# Patient Record
Sex: Male | Born: 1935 | Race: White | Hispanic: No | Smoking: Never smoker
Health system: Southern US, Community
[De-identification: ages and names within clinical notes are randomized; demographics above are authoritative.]

## PROBLEM LIST (undated history)

## (undated) DIAGNOSIS — F0390 Unspecified dementia without behavioral disturbance: Secondary | ICD-10-CM

## (undated) DIAGNOSIS — M810 Age-related osteoporosis without current pathological fracture: Secondary | ICD-10-CM

---

## 2004-05-08 ENCOUNTER — Ambulatory Visit (HOSPITAL_COMMUNITY): Admission: RE | Admit: 2004-05-08 | Discharge: 2004-05-08 | Payer: Self-pay | Admitting: Ophthalmology

## 2011-08-22 ENCOUNTER — Encounter (HOSPITAL_COMMUNITY): Admission: RE | Admit: 2011-08-22 | Payer: Medicare Other | Source: Ambulatory Visit

## 2013-03-03 ENCOUNTER — Emergency Department (HOSPITAL_COMMUNITY): Payer: Medicare Other

## 2013-03-03 ENCOUNTER — Encounter (HOSPITAL_COMMUNITY): Payer: Self-pay | Admitting: Emergency Medicine

## 2013-03-03 ENCOUNTER — Inpatient Hospital Stay (HOSPITAL_COMMUNITY)
Admission: EM | Admit: 2013-03-03 | Discharge: 2013-03-08 | DRG: 178 | Disposition: A | Discharge status: Skilled Nursing Facility | Payer: Medicare Other | Attending: Internal Medicine | Admitting: Internal Medicine

## 2013-03-03 DIAGNOSIS — H9190 Unspecified hearing loss, unspecified ear: Secondary | ICD-10-CM | POA: Diagnosis present

## 2013-03-03 DIAGNOSIS — F0390 Unspecified dementia without behavioral disturbance: Secondary | ICD-10-CM | POA: Diagnosis present

## 2013-03-03 DIAGNOSIS — F05 Delirium due to known physiological condition: Secondary | ICD-10-CM | POA: Diagnosis present

## 2013-03-03 DIAGNOSIS — Y92009 Unspecified place in unspecified non-institutional (private) residence as the place of occurrence of the external cause: Secondary | ICD-10-CM

## 2013-03-03 DIAGNOSIS — W19XXXA Unspecified fall, initial encounter: Secondary | ICD-10-CM | POA: Diagnosis present

## 2013-03-03 DIAGNOSIS — Z6829 Body mass index (BMI) 29.0-29.9, adult: Secondary | ICD-10-CM

## 2013-03-03 DIAGNOSIS — R404 Transient alteration of awareness: Secondary | ICD-10-CM | POA: Diagnosis not present

## 2013-03-03 DIAGNOSIS — R296 Repeated falls: Secondary | ICD-10-CM

## 2013-03-03 DIAGNOSIS — R531 Weakness: Secondary | ICD-10-CM

## 2013-03-03 DIAGNOSIS — E46 Unspecified protein-calorie malnutrition: Secondary | ICD-10-CM | POA: Diagnosis not present

## 2013-03-03 DIAGNOSIS — M25569 Pain in unspecified knee: Secondary | ICD-10-CM | POA: Diagnosis present

## 2013-03-03 DIAGNOSIS — M810 Age-related osteoporosis without current pathological fracture: Secondary | ICD-10-CM | POA: Diagnosis present

## 2013-03-03 DIAGNOSIS — R5381 Other malaise: Secondary | ICD-10-CM | POA: Diagnosis not present

## 2013-03-03 DIAGNOSIS — J690 Pneumonitis due to inhalation of food and vomit: Principal | ICD-10-CM | POA: Diagnosis present

## 2013-03-03 DIAGNOSIS — R17 Unspecified jaundice: Secondary | ICD-10-CM | POA: Diagnosis present

## 2013-03-03 DIAGNOSIS — R339 Retention of urine, unspecified: Secondary | ICD-10-CM | POA: Diagnosis present

## 2013-03-03 DIAGNOSIS — M6282 Rhabdomyolysis: Secondary | ICD-10-CM | POA: Diagnosis present

## 2013-03-03 HISTORY — DX: Age-related osteoporosis without current pathological fracture: M81.0

## 2013-03-03 HISTORY — DX: Unspecified dementia, unspecified severity, without behavioral disturbance, psychotic disturbance, mood disturbance, and anxiety: F03.90

## 2013-03-03 LAB — CG4 I-STAT (LACTIC ACID): Lactic Acid, Venous: 2.41 mmol/L — ABNORMAL HIGH (ref 0.5–2.2)

## 2013-03-03 LAB — CBC WITH DIFFERENTIAL/PLATELET
Basophils Absolute: 0 10*3/uL (ref 0.0–0.1)
Basophils Relative: 0 % (ref 0–1)
Hemoglobin: 16.3 g/dL (ref 13.0–17.0)
MCH: 33.5 pg (ref 26.0–34.0)
Monocytes Absolute: 1.3 10*3/uL — ABNORMAL HIGH (ref 0.1–1.0)
Monocytes Relative: 12 % (ref 3–12)
RBC: 4.87 MIL/uL (ref 4.22–5.81)
RDW: 13.6 % (ref 11.5–15.5)

## 2013-03-03 LAB — COMPREHENSIVE METABOLIC PANEL
CO2: 21 mEq/L (ref 19–32)
Creatinine, Ser: 0.8 mg/dL (ref 0.50–1.35)
GFR calc Af Amer: 86 mL/min — ABNORMAL LOW (ref >90)
GFR calc non Af Amer: 74 mL/min — ABNORMAL LOW (ref >90)
Glucose, Bld: 98 mg/dL (ref 70–99)
Potassium: 3.9 mEq/L (ref 3.5–5.1)
Total Bilirubin: 2.5 mg/dL — ABNORMAL HIGH (ref 0.3–1.2)

## 2013-03-03 LAB — URINE MICROSCOPIC-ADD ON

## 2013-03-03 LAB — URINALYSIS, ROUTINE W REFLEX MICROSCOPIC
Glucose, UA: NEGATIVE mg/dL
Ketones, ur: 15 mg/dL — AB
Protein, ur: NEGATIVE mg/dL
Specific Gravity, Urine: 1.021 (ref 1.005–1.030)

## 2013-03-03 MED ORDER — LEVOFLOXACIN IN D5W 500 MG/100ML IV SOLN
500.0000 mg | Freq: Once | INTRAVENOUS | Status: AC
  Administered 2013-03-04: 500 mg via INTRAVENOUS
  Filled 2013-03-03 (×2): qty 100

## 2013-03-03 MED ORDER — SODIUM CHLORIDE 0.9 % IV BOLUS (SEPSIS)
500.0000 mL | Freq: Once | INTRAVENOUS | Status: AC
  Administered 2013-03-03: 500 mL via INTRAVENOUS

## 2013-03-03 NOTE — H&P (Signed)
PCP:   No PCP Per Patient   Chief Complaint:  Found down by family.  HPI: Patient is a 79 year old male who normally lives independently.  Was found down by his family today.  THey see him on a daily basis and indicated that he was in his usual state of health yesterday.  He was found in the bathroom today.  THe patient cannot remember the fall.  States pain in R knee but evaluated in ER and no fracture found.  Review of Systems:  Review of Systems - Negative except R knee pain and unaware on circumstances of fall.  Otherwise negative on 12 point review. Past Medical History: Past Medical History  Diagnosis Date  . Dementia   . Osteoporosis    No past surgical history on file.  Medications: Prior to Admission medications   Not on File    Allergies:  No Known Allergies  Social History:  has no tobacco, alcohol, and drug history on file.  Physical Exam: Filed Vitals:   03/03/13 2140 03/03/13 2145  BP: 152/68 120/72  Pulse: 102 101  Temp: 99.7 F (37.6 C)   TempSrc: Oral   Resp: 16 24  SpO2: 90% 91%   General appearance: alert, cooperative and appears stated age Head: Normocephalic, without obvious abnormality, atraumatic Eyes: conjunctivae/corneas clear. PERRL, EOM's intact.  Nose: Nares normal. Septum midline. Mucosa normal. No drainage or sinus tenderness. Throat: lips, mucosa, and tongue normal; teeth and gums normal Neck: no adenopathy, no carotid bruit, no JVD and thyroid not enlarged, symmetric, no tenderness/mass/nodules Resp: clear to auscultation bilaterally Cardio: regular rate and rhythm, S1, S2 normal, no murmur, click, rub or gallop GI: soft, non-tender; bowel sounds normal; no masses,  no organomegaly Extremities: extremities normal, atraumatic, no cyanosis or edema Pulses: 2+ and symmetric Lymph nodes: Cervical adenopathy: no cervical lymphadenopathy Neurologic: Alert and oriented X 3, normal strength and tone. Normal symmetric reflexes.     Labs  on Admission:   Recent Labs  03/03/13 2145  NA 137  K 3.9  CL 101  CO2 21  GLUCOSE 98  BUN 19  CREATININE 0.80  CALCIUM 9.2    Recent Labs  03/03/13 2145  AST 46*  ALT 16  ALKPHOS 55  BILITOT 2.5*  PROT 7.0  ALBUMIN 3.7    Recent Labs  03/03/13 2145  WBC 11.6*  NEUTROABS 9.4*  HGB 16.3  HCT 45.5  MCV 93.4  PLT 206    Recent Labs  03/03/13 2145  CKTOTAL 1446*    Radiological Exams on Admission: Dg Chest 1 View  03/03/2013   CLINICAL DATA:  Dementia, fall.  EXAM: CHEST - 1 VIEW  COMPARISON:  05/03/2004  FINDINGS: Prominent cardiomediastinal contours. Hyperaeration. Mild lung base opacities. Small effusions not excluded. No pneumothorax. Multilevel degenerative change.  IMPRESSION: Prominent cardiac contour, similar to prior.  Hypoaeration with mild lung base opacities ; atelectasis versus infiltrate.   Electronically Signed   By: Jearld Lesch M.D.   On: 03/03/2013 23:00   Dg Pelvis 1-2 Views  03/03/2013   CLINICAL DATA:  Fall and demented.  EXAM: PELVIS - 1-2 VIEW  COMPARISON:  None.  FINDINGS: Two views of the pelvis. Both are moderate to markedly degraded due to overlying pannus. The femoral heads are located. L5-S1 degenerative disc disease. Sacroiliac joints are grossly symmetric. No displaced fracture identified. Equivocal osseous irregularity about the left femoral neck.  IMPRESSION: Moderate to markedly degraded exam secondary to overlying pannus. No gross acute osseous  abnormality. Favor degenerative irregularity about the left femoral neck. Correlate with left-sided symptoms. If symptoms warrant, consider CT.   Electronically Signed   By: Jeronimo Greaves M.D.   On: 03/03/2013 22:50   Ct Head Wo Contrast  03/03/2013   CLINICAL DATA:  Fall.  Demented.  EXAM: CT HEAD WITHOUT CONTRAST  CT CERVICAL SPINE WITHOUT CONTRAST  TECHNIQUE: Multidetector CT imaging of the head and cervical spine was performed following the standard protocol without intravenous contrast.  Multiplanar CT image reconstructions of the cervical spine were also generated.  COMPARISON:  None.  FINDINGS: CT HEAD FINDINGS  Sinuses/Soft tissues: Mucosal thickening of the right maxillary sinus. Bilateral antrostomies. Minimal motion degradation. Clear mastoid air cells.  Intracranial: Expected cerebral atrophy. Relatively mild low density in the periventricular white matter likely related to small vessel disease. No mass lesion, hemorrhage, hydrocephalus, acute infarct, intra-axial, or extra-axial fluid collection.  CT CERVICAL SPINE FINDINGS  Spinal visualization through the bottom of T1. Prevertebral soft tissues are within normal limits. Carotid atherosclerosis. No apical pneumothorax. Cerumen within the bilateral external ear canals. Skull base intact. Multilevel spondylosis. Most advanced at C5-6 and C6-7. Facets are well-aligned. Advanced left greater than right facet arthropathy, including at C3-4 and C4-5.  Coronal reformats demonstrate a normal C1-C2 articulation.  IMPRESSION: 1.  No acute intracranial abnormality. 2. No acute fracture or subluxation within the cervical spine. 3. Advanced spondylosis. Straightening of expected cervical lordosis could be positional, due to muscular spasm, or ligamentous injury.   Electronically Signed   By: Jeronimo Greaves M.D.   On: 03/03/2013 22:29   Ct Cervical Spine Wo Contrast  03/03/2013   CLINICAL DATA:  Fall.  Demented.  EXAM: CT HEAD WITHOUT CONTRAST  CT CERVICAL SPINE WITHOUT CONTRAST  TECHNIQUE: Multidetector CT imaging of the head and cervical spine was performed following the standard protocol without intravenous contrast. Multiplanar CT image reconstructions of the cervical spine were also generated.  COMPARISON:  None.  FINDINGS: CT HEAD FINDINGS  Sinuses/Soft tissues: Mucosal thickening of the right maxillary sinus. Bilateral antrostomies. Minimal motion degradation. Clear mastoid air cells.  Intracranial: Expected cerebral atrophy. Relatively mild low  density in the periventricular white matter likely related to small vessel disease. No mass lesion, hemorrhage, hydrocephalus, acute infarct, intra-axial, or extra-axial fluid collection.  CT CERVICAL SPINE FINDINGS  Spinal visualization through the bottom of T1. Prevertebral soft tissues are within normal limits. Carotid atherosclerosis. No apical pneumothorax. Cerumen within the bilateral external ear canals. Skull base intact. Multilevel spondylosis. Most advanced at C5-6 and C6-7. Facets are well-aligned. Advanced left greater than right facet arthropathy, including at C3-4 and C4-5.  Coronal reformats demonstrate a normal C1-C2 articulation.  IMPRESSION: 1.  No acute intracranial abnormality. 2. No acute fracture or subluxation within the cervical spine. 3. Advanced spondylosis. Straightening of expected cervical lordosis could be positional, due to muscular spasm, or ligamentous injury.   Electronically Signed   By: Jeronimo Greaves M.D.   On: 03/03/2013 22:29   Dg Knee Right Port  03/03/2013   CLINICAL DATA:  Fall with right knee pain and abrasion.  EXAM: PORTABLE RIGHT KNEE - 1-2 VIEW  COMPARISON:  None.  FINDINGS: AP and cross-table lateral views. Minimal medial mild patellofemoral joint space narrowing and osteophyte formation. No acute fracture or dislocation. Difficult to exclude a small suprapatellar joint effusion.  IMPRESSION: Possible small suprapatellar joint effusion. No fracture identified. If possible, dedicated four view series should be considered.   Electronically Signed   By: Ronaldo Miyamoto  Reche Dixon M.D.   On: 03/03/2013 23:50   Orders placed during the hospital encounter of 03/03/13  . EKG 12-LEAD  . EKG 12-LEAD    Assessment/Plan Active Problems:   Aspiration into lower respiratory tract Will get swallowing eval at bedside and consider further eval in AM.  Levaquin.  WIll also check U/A as CXR is very unclear as to whether there is a true pneumonia. Dementia:  He has not been seen in our  office since 2013.  Given lack of continuity, will have his regular MD assess in the AM. Osteoporosis- Previously on Reclast, overdue.  May need to resume given fall. PT/OT and possible social work for home needs. IV hydration  Kamiryn Bezanson W 03/03/2013, 11:54 PM

## 2013-03-03 NOTE — ED Notes (Signed)
Yao MD at bedside. 

## 2013-03-03 NOTE — ED Notes (Signed)
Per EMS pt fell at home at 11AM has dementia- down for unknown time on bathroom floor (last seen yesterday morning). Family has been attempting to get patient up since then. Pt was agitated so family called to get assistance. Denies LOC sts he remembers falling but couldn't get up. Baseline pt walks with walker. EMS sts pt was bilaterally weak when lifting pt up. Pt denies pain. Pt does not have PCP.

## 2013-03-03 NOTE — ED Provider Notes (Signed)
CSN: [id number]     Arrival date & time 03/03/13  2135 History   None    Chief Complaint  Patient presents with  . Fall   (Consider location/radiation/quality/duration/timing/severity/associated sxs/prior Treatment) HPI Comments: 79 year old male history multiple falls and was found down by family today. Patient does live at home. Family has daily. Reports he was is normal state of health yesterday. When writing the house and they found him down in the bathroom. Unknown, fall. Patient is unable to give detailed information of the fall. He does state he is not hurting anywhere other than his right knee. States it is on fall multiple days ago. Family endorses this. Patient states mild pain on the right knee. It is minimal. Is resolving. No radiation of pain. Intact range of motion. No changes to health otherwise.  Patient is a 79 y.o. male presenting with fall.  Fall This is a recurrent problem. The current episode started today. The problem occurs every several days. The problem has been unchanged. Nothing aggravates the symptoms. He has tried nothing for the symptoms. The treatment provided no relief.    Past Medical History  Diagnosis Date  . Dementia   . Osteoporosis    No past surgical history on file. No family history on file. History  Substance Use Topics  . Smoking status: Not on file  . Smokeless tobacco: Not on file  . Alcohol Use: Not on file    Review of Systems  Unable to perform ROS: Dementia    Allergies  Review of patient's allergies indicates no known allergies.  Home Medications  No current outpatient prescriptions on file. BP 120/72  Pulse 101  Temp(Src) 99.7 F (37.6 C) (Oral)  Resp 24  SpO2 91% Physical Exam  Nursing note and vitals reviewed. Constitutional: He is oriented to person, place, and time. He appears well-developed and well-nourished.  HENT:  Head: Normocephalic and atraumatic.  Eyes: EOM are normal. Pupils are equal, round, and reactive  to light.  Neck: Normal range of motion.  Cardiovascular: Normal rate, regular rhythm and intact distal pulses.   Pulmonary/Chest: Effort normal and breath sounds normal. No respiratory distress.  Abdominal: Soft. He exhibits no distension. There is no tenderness.  Musculoskeletal: Normal range of motion.  Neurological: He is alert and oriented to person, place, and time. No cranial nerve deficit. He exhibits normal muscle tone. Coordination normal.  Skin: Skin is warm and dry. No rash noted.  Psychiatric: He has a normal mood and affect. His behavior is normal. Judgment and thought content normal.    ED Course  Procedures (including critical care time) Labs Review Labs Reviewed  CBC WITH DIFFERENTIAL - Abnormal; Notable for the following:    WBC 11.6 (*)    Neutrophils Relative % 82 (*)    Neutro Abs 9.4 (*)    Lymphocytes Relative 7 (*)    Monocytes Absolute 1.3 (*)    All other components within normal limits  COMPREHENSIVE METABOLIC PANEL - Abnormal; Notable for the following:    AST 46 (*)    Total Bilirubin 2.5 (*)    GFR calc non Af Amer 74 (*)    GFR calc Af Amer 86 (*)    All other components within normal limits  CK - Abnormal; Notable for the following:    Total CK 1446 (*)    All other components within normal limits  URINALYSIS, ROUTINE W REFLEX MICROSCOPIC - Abnormal; Notable for the following:    Color, Urine AMBER (*)  Hgb urine dipstick MODERATE (*)    Bilirubin Urine SMALL (*)    Ketones, ur 15 (*)    All other components within normal limits  CG4 I-STAT (LACTIC ACID) - Abnormal; Notable for the following:    Lactic Acid, Venous 2.41 (*)    All other components within normal limits  CULTURE, BLOOD (ROUTINE X 2)  CULTURE, BLOOD (ROUTINE X 2)  URINE MICROSCOPIC-ADD ON  POCT I-STAT TROPONIN I   Imaging Review Dg Chest 1 View  03/03/2013   CLINICAL DATA:  Dementia, fall.  EXAM: CHEST - 1 VIEW  COMPARISON:  05/03/2004  FINDINGS: Prominent  cardiomediastinal contours. Hyperaeration. Mild lung base opacities. Small effusions not excluded. No pneumothorax. Multilevel degenerative change.  IMPRESSION: Prominent cardiac contour, similar to prior.  Hypoaeration with mild lung base opacities ; atelectasis versus infiltrate.   Electronically Signed   By: Jearld Lesch M.D.   On: 03/03/2013 23:00   Dg Pelvis 1-2 Views  03/03/2013   CLINICAL DATA:  Fall and demented.  EXAM: PELVIS - 1-2 VIEW  COMPARISON:  None.  FINDINGS: Two views of the pelvis. Both are moderate to markedly degraded due to overlying pannus. The femoral heads are located. L5-S1 degenerative disc disease. Sacroiliac joints are grossly symmetric. No displaced fracture identified. Equivocal osseous irregularity about the left femoral neck.  IMPRESSION: Moderate to markedly degraded exam secondary to overlying pannus. No gross acute osseous abnormality. Favor degenerative irregularity about the left femoral neck. Correlate with left-sided symptoms. If symptoms warrant, consider CT.   Electronically Signed   By: Jeronimo Greaves M.D.   On: 03/03/2013 22:50   Ct Head Wo Contrast  03/03/2013   CLINICAL DATA:  Fall.  Demented.  EXAM: CT HEAD WITHOUT CONTRAST  CT CERVICAL SPINE WITHOUT CONTRAST  TECHNIQUE: Multidetector CT imaging of the head and cervical spine was performed following the standard protocol without intravenous contrast. Multiplanar CT image reconstructions of the cervical spine were also generated.  COMPARISON:  None.  FINDINGS: CT HEAD FINDINGS  Sinuses/Soft tissues: Mucosal thickening of the right maxillary sinus. Bilateral antrostomies. Minimal motion degradation. Clear mastoid air cells.  Intracranial: Expected cerebral atrophy. Relatively mild low density in the periventricular white matter likely related to small vessel disease. No mass lesion, hemorrhage, hydrocephalus, acute infarct, intra-axial, or extra-axial fluid collection.  CT CERVICAL SPINE FINDINGS  Spinal  visualization through the bottom of T1. Prevertebral soft tissues are within normal limits. Carotid atherosclerosis. No apical pneumothorax. Cerumen within the bilateral external ear canals. Skull base intact. Multilevel spondylosis. Most advanced at C5-6 and C6-7. Facets are well-aligned. Advanced left greater than right facet arthropathy, including at C3-4 and C4-5.  Coronal reformats demonstrate a normal C1-C2 articulation.  IMPRESSION: 1.  No acute intracranial abnormality. 2. No acute fracture or subluxation within the cervical spine. 3. Advanced spondylosis. Straightening of expected cervical lordosis could be positional, due to muscular spasm, or ligamentous injury.   Electronically Signed   By: Jeronimo Greaves M.D.   On: 03/03/2013 22:29   Ct Cervical Spine Wo Contrast  03/03/2013   CLINICAL DATA:  Fall.  Demented.  EXAM: CT HEAD WITHOUT CONTRAST  CT CERVICAL SPINE WITHOUT CONTRAST  TECHNIQUE: Multidetector CT imaging of the head and cervical spine was performed following the standard protocol without intravenous contrast. Multiplanar CT image reconstructions of the cervical spine were also generated.  COMPARISON:  None.  FINDINGS: CT HEAD FINDINGS  Sinuses/Soft tissues: Mucosal thickening of the right maxillary sinus. Bilateral antrostomies. Minimal motion degradation. Clear  mastoid air cells.  Intracranial: Expected cerebral atrophy. Relatively mild low density in the periventricular white matter likely related to small vessel disease. No mass lesion, hemorrhage, hydrocephalus, acute infarct, intra-axial, or extra-axial fluid collection.  CT CERVICAL SPINE FINDINGS  Spinal visualization through the bottom of T1. Prevertebral soft tissues are within normal limits. Carotid atherosclerosis. No apical pneumothorax. Cerumen within the bilateral external ear canals. Skull base intact. Multilevel spondylosis. Most advanced at C5-6 and C6-7. Facets are well-aligned. Advanced left greater than right facet  arthropathy, including at C3-4 and C4-5.  Coronal reformats demonstrate a normal C1-C2 articulation.  IMPRESSION: 1.  No acute intracranial abnormality. 2. No acute fracture or subluxation within the cervical spine. 3. Advanced spondylosis. Straightening of expected cervical lordosis could be positional, due to muscular spasm, or ligamentous injury.   Electronically Signed   By: Jeronimo Greaves M.D.   On: 03/03/2013 22:29   Dg Knee Right Port  03/03/2013   CLINICAL DATA:  Fall with right knee pain and abrasion.  EXAM: PORTABLE RIGHT KNEE - 1-2 VIEW  COMPARISON:  None.  FINDINGS: AP and cross-table lateral views. Minimal medial mild patellofemoral joint space narrowing and osteophyte formation. No acute fracture or dislocation. Difficult to exclude a small suprapatellar joint effusion.  IMPRESSION: Possible small suprapatellar joint effusion. No fracture identified. If possible, dedicated four view series should be considered.   Electronically Signed   By: Jeronimo Greaves M.D.   On: 03/03/2013 23:50    EKG Interpretation    Date/Time:  Wednesday March 03 2013 21:37:52 EST Ventricular Rate:  102 PR Interval:  152 QRS Duration: 87 QT Interval:  363 QTC Calculation: 473 R Axis:   6 Text Interpretation:  Sinus tachycardia Probable left atrial enlargement Abnormal R-wave progression, early transition Borderline repolarization abnormality Rate faster since previous  Confirmed by YAO  MD, DAVID [phone]) on 03/03/2013 9:44:16 PM            MDM   1. Fall at home, initial encounter   2. Aspiration pneumonia     79 year old male status Stefhanie Kachmar fall at home. Patient on exam has no tenderness palpation, injuries or obvious deformities. However secondary patient been down for unknown amount of time and the patient with dementia and unable to get a good story labs and imaging initiated. CT head and C-spine normal. X-rays of pelvis within normal limits although questionable finding on the pelvis regarding  fever. CT pelvis was obtained showing no femur fracture. On arrival patient was noted to be slightly tachycardic with SpO2 90. Chest x-ray showed possible developing aspiration pneumonia. CK also was slightly elevated at 1500. Patient was started on antibiotics given fluids for elevated CK. Multiple comorbidities and elderly patient will admit to  Medicine for continued evaluation and treatment remained stable with no further acute issues ED    Bridgett Larsson, MD 03/03/13 2358

## 2013-03-04 ENCOUNTER — Encounter (HOSPITAL_COMMUNITY): Payer: Self-pay | Admitting: *Deleted

## 2013-03-04 DIAGNOSIS — R339 Retention of urine, unspecified: Secondary | ICD-10-CM | POA: Diagnosis present

## 2013-03-04 DIAGNOSIS — R5381 Other malaise: Secondary | ICD-10-CM

## 2013-03-04 DIAGNOSIS — R296 Repeated falls: Secondary | ICD-10-CM

## 2013-03-04 DIAGNOSIS — M6282 Rhabdomyolysis: Secondary | ICD-10-CM | POA: Diagnosis present

## 2013-03-04 LAB — COMPREHENSIVE METABOLIC PANEL
ALT: 15 U/L (ref 0–53)
Alkaline Phosphatase: 50 U/L (ref 39–117)
BUN: 18 mg/dL (ref 6–23)
CO2: 21 mEq/L (ref 19–32)
Creatinine, Ser: 0.72 mg/dL (ref 0.50–1.35)
GFR calc Af Amer: 89 mL/min — ABNORMAL LOW (ref >90)
GFR calc non Af Amer: 77 mL/min — ABNORMAL LOW (ref >90)
Glucose, Bld: 86 mg/dL (ref 70–99)
Potassium: 3.4 mEq/L — ABNORMAL LOW (ref 3.5–5.1)
Total Protein: 6.3 g/dL (ref 6.0–8.3)

## 2013-03-04 LAB — CBC
HCT: 43.1 % (ref 39.0–52.0)
Hemoglobin: 15.3 g/dL (ref 13.0–17.0)
MCH: 33.2 pg (ref 26.0–34.0)
MCHC: 35.5 g/dL (ref 30.0–36.0)
RBC: 4.61 MIL/uL (ref 4.22–5.81)

## 2013-03-04 LAB — TSH: TSH: 3.391 u[IU]/mL (ref 0.350–4.500)

## 2013-03-04 LAB — BILIRUBIN, DIRECT: Bilirubin, Direct: 0.5 mg/dL — ABNORMAL HIGH (ref 0.0–0.3)

## 2013-03-04 MED ORDER — TAMSULOSIN HCL 0.4 MG PO CAPS
0.4000 mg | ORAL_CAPSULE | Freq: Every day | ORAL | Status: DC
  Administered 2013-03-04 – 2013-03-08 (×5): 0.4 mg via ORAL
  Filled 2013-03-04 (×7): qty 1

## 2013-03-04 MED ORDER — POTASSIUM CHLORIDE IN NACL 20-0.9 MEQ/L-% IV SOLN
INTRAVENOUS | Status: DC
  Administered 2013-03-04 (×2): via INTRAVENOUS
  Administered 2013-03-05 (×2): 75 mL/h via INTRAVENOUS
  Administered 2013-03-06: 21:00:00 via INTRAVENOUS
  Filled 2013-03-04 (×10): qty 1000

## 2013-03-04 MED ORDER — ONDANSETRON HCL 4 MG/2ML IJ SOLN: 4.0000 mg | Freq: Four times a day (QID) | INTRAMUSCULAR | Status: DC | PRN

## 2013-03-04 MED ORDER — HYDROCODONE-ACETAMINOPHEN 5-325 MG PO TABS
1.0000 | ORAL_TABLET | ORAL | Status: DC | PRN
  Administered 2013-03-04: 1 via ORAL
  Administered 2013-03-04: 2 via ORAL
  Filled 2013-03-04 (×2): qty 2

## 2013-03-04 MED ORDER — ACETAMINOPHEN 650 MG RE SUPP: 650.0000 mg | Freq: Four times a day (QID) | RECTAL | Status: DC | PRN

## 2013-03-04 MED ORDER — ACETAMINOPHEN 325 MG PO TABS: 650.0000 mg | ORAL_TABLET | Freq: Four times a day (QID) | ORAL | Status: DC | PRN

## 2013-03-04 MED ORDER — ENOXAPARIN SODIUM 40 MG/0.4ML ~~LOC~~ SOLN
40.0000 mg | Freq: Every day | SUBCUTANEOUS | Status: DC
  Administered 2013-03-04 – 2013-03-08 (×5): 40 mg via SUBCUTANEOUS
  Filled 2013-03-04 (×5): qty 0.4

## 2013-03-04 MED ORDER — LEVOFLOXACIN 500 MG PO TABS
500.0000 mg | ORAL_TABLET | Freq: Every day | ORAL | Status: DC
  Administered 2013-03-04 – 2013-03-07 (×4): 500 mg via ORAL
  Filled 2013-03-04 (×5): qty 1

## 2013-03-04 MED ORDER — DOCUSATE SODIUM 100 MG PO CAPS
100.0000 mg | ORAL_CAPSULE | Freq: Two times a day (BID) | ORAL | Status: DC
  Administered 2013-03-04 – 2013-03-08 (×8): 100 mg via ORAL
  Filled 2013-03-04 (×11): qty 1

## 2013-03-04 MED ORDER — ZOLPIDEM TARTRATE 5 MG PO TABS: 5.0000 mg | ORAL_TABLET | Freq: Every evening | ORAL | Status: DC | PRN

## 2013-03-04 MED ORDER — ONDANSETRON HCL 4 MG PO TABS: 4.0000 mg | ORAL_TABLET | Freq: Four times a day (QID) | ORAL | Status: DC | PRN

## 2013-03-04 NOTE — Evaluation (Signed)
Clinical/Bedside Swallow Evaluation Patient Details  Name: Anthony [name] MRN: [id number] Date of Birth: July 05, 1915  Today's Date: 03/04/2013 Time: 7846-9629 SLP Time Calculation (min): 30 min  Past Medical History:  Past Medical History  Diagnosis Date  . Dementia   . Osteoporosis    Past Surgical History: History reviewed. No pertinent past surgical history. HPI:  Patient is a 79 year old male who normally lives independently. Was found down by his family today. THey see him on a daily basis and indicated that he was in his usual state of health yesterday. He was found in the bathroom today. THe patient cannot remember the fall. States pain in R knee but evaluated in ER and no fracture found. MD has given pt dx of aspiration pna or aspiration pneumonitis.    Assessment / Plan / Recommendation Clinical Impression  Pt presents with adequate management of solids and liquids with small sips and appropriate posture. No overt signs of aspiration. Pts family reports pt has consumed regular solids and liquids at home pta without difficulty. Suspect any aspriation pna more likely due to being down all night, aspirating secretions or reflux, than direct aspiration of PO based on observation. Discussed basic precautions with family, discussing risk due to lethargy (pt being given pain medication) and respiratory distress. At this time pt may initaite a regular diet and thin liquids. No SLP f/u needed.     Aspiration Risk  Mild    Diet Recommendation Regular;Thin liquid   Liquid Administration via: Cup;Straw Medication Administration: Whole meds with liquid Supervision: Patient able to self feed Postural Changes and/or Swallow Maneuvers: Seated upright 90 degrees    Other  Recommendations Oral Care Recommendations: Oral care BID   Follow Up Recommendations  None    Frequency and Duration        Pertinent Vitals/Pain NA    SLP Swallow Goals     Swallow Study Prior Functional Status  Type of Home: House Available Help at Discharge: Family;Available PRN/intermittently    General HPI: Patient is a 79 year old male who normally lives independently. Was found down by his family today. THey see him on a daily basis and indicated that he was in his usual state of health yesterday. He was found in the bathroom today. THe patient cannot remember the fall. States pain in R knee but evaluated in ER and no fracture found. MD has given pt dx of aspiration pna or aspiration pneumonitis.  Type of Study: Bedside swallow evaluation Diet Prior to this Study: NPO Temperature Spikes Noted: No Respiratory Status: Nasal cannula History of Recent Intubation: No Behavior/Cognition: Alert;Cooperative;Pleasant mood Oral Cavity - Dentition: Missing dentition Self-Feeding Abilities: Able to feed self Patient Positioning: Upright in chair Baseline Vocal Quality: Clear Volitional Cough: Strong Volitional Swallow: Able to elicit    Oral/Motor/Sensory Function Overall Oral Motor/Sensory Function: Appears within functional limits for tasks assessed   Ice Chips     Thin Liquid Thin Liquid: Within functional limits    Nectar Thick Nectar Thick Liquid: Not tested   Honey Thick Honey Thick Liquid: Not tested   Puree Puree: Within functional limits   Solid   GO    Solid: Within functional limits      [facility], [place] CCC-SLP [phone]  Anthony [name] 03/04/2013,2:02 PM

## 2013-03-04 NOTE — Consult Note (Signed)
Physical Medicine and Rehabilitation Consult Reason for Consult: Deconditioning/rhabdomyolysis/pneumonia Referring Physician: Dr. Wylene Simmer   HPI: Anthony [name] is a 79 y.o. right-handed male with documented history of mild dementia Living alone in independent prior to admission. Admitted 03/03/2013 after being found down in the bathroom. Cranial CT scan was negative for any acute changes. CT cervical spine negative. X-rays of right knee no fracture possible small suprapatellar joint effusion. Chest x-ray with atelectasis versus infiltrate. Patient is on a regular diet. Patient placed on empiric Levaquin. Subcutaneous Lovenox added for DVT prophylaxis. Noted bouts of urinary retention Flomax at it plan voiding trial. Occupational therapy evaluation completed with recommendations of physical medicine rehabilitation consult to consider inpatient rehabilitation services.  Extremely hard of hearing. No hearing aides. Daughter at bedside. Review of Systems  HENT: Positive for hearing loss.   Musculoskeletal: Positive for joint pain and myalgias.  Psychiatric/Behavioral: Positive for memory loss.  All other systems reviewed and are negative.   Past Medical History  Diagnosis Date  . Dementia   . Osteoporosis    History reviewed. No pertinent past surgical history. History reviewed. No pertinent family history. Social History:  reports that he has never smoked. He has never used smokeless tobacco. He reports that he does not drink alcohol. His drug history is not on file. Allergies: No Known Allergies No prescriptions prior to admission    Home: Home Living Family/patient expects to be discharged to:: Inpatient rehab (vs SNF) Living Arrangements: Alone Available Help at Discharge: Family;Available PRN/intermittently Type of Home: House Home Equipment: Grab bars - tub/shower;Grab bars - toilet Additional Comments: per daughter, Has a walk in shower with zero entry, no curtain or door, no  bathroom DME except he has installed several grab bars throughout bathroom.  Functional History: Prior Function Comments: Local daughter reports he has been independent except for recent decline ~10 days.  Daughter has been checking in daily and occasionally providing assistance with IADLs.  Patient with h/o dementia and daughters decribe him as "gruff".  This OT did not see any gruffness during the evaluation Functional Status:  Mobility: Bed Mobility Bed Mobility: Rolling Right;Right Sidelying to Sit;Sitting - Scoot to Edge of Bed Rolling Right: 5: Supervision Right Sidelying to Sit: 4: Min assist Sitting - Scoot to Edge of Bed: 4: Min assist Transfers Sit to Stand: 1: +1 Total assist Stand to Sit: 2: Max assist      ADL: ADL Eating/Feeding: NPO Grooming: Performed;Wash/dry hands;Moderate assistance Where Assessed - Grooming: Supported standing Upper Body Bathing: Simulated;Set up Where Assessed - Upper Body Bathing: Supported sitting Lower Body Bathing: Simulated;+1 Total assistance Where Assessed - Lower Body Bathing: Supported sitting;Supported sit to stand;Supported standing Upper Body Dressing: Simulated;Minimal assistance Where Assessed - Upper Body Dressing: Supported sitting Lower Body Dressing: Simulated;Performed;+1 Total assistance Where Assessed - Lower Body Dressing: Supported sitting;Supported sit to stand;Supported standing Toilet Transfer: Performed;+2 Total assistance (bed><recliner) Statistician Method: Squat pivot Transfers/Ambulation Related to ADLs: Patient favoring his painful RLE during total +2 squat pivot transfer and with sit><stand at sink.  Patient relied heavily on sink to stand with Total assist.   ADL Comments: Patient unable to unweight one hand in standing to wash or dry both hands at same time, able to remove gripper socks however unable to donn them due to right knee pain.  Cognition: Cognition Overall Cognitive Status: History of cognitive  impairments - at baseline [facility] for tasks assessed) Orientation Level: Oriented to person;Disoriented to time;Disoriented to place Cognition Arousal/Alertness: Awake/alert Behavior During Therapy:  WFL for tasks assessed/performed Overall Cognitive Status: History of cognitive impairments - at baseline [facility] for tasks assessed)  Blood pressure 120/71, pulse 112, temperature 98.1 F (36.7 C), temperature source Axillary, resp. rate 16, height 5\' 11"  (1.803 m), weight 93.7 kg (206 lb 9.1 oz), SpO2 84.00%. Physical Exam  Constitutional:  79 year old white male very hard of hearing  HENT:  Head: Normocephalic.  Neck: Normal range of motion. Neck supple. No thyromegaly present.  Cardiovascular: Normal rate and regular rhythm.   Respiratory: Breath sounds normal. No respiratory distress.  GI: Soft. Bowel sounds are normal. He exhibits no distension.  Neurological: He is alert.  Patient made good eye contact with examiner however exam limited secondary to being very hard of hearing. He did follow simple demonstrate commands.  Skin: Skin is warm and dry.   motor strength is 5/5 bilateral deltoid, bicep, tricep, grip 4 minus/5 bilateral hip flexor 4 minus right knee extensor 5 left knee extensor 5 ankle dorsiflexors and plantar flexors  Results for orders placed during the hospital encounter of 03/03/13 (from the past 24 hour(s))  CBC WITH DIFFERENTIAL     Status: Abnormal   Collection Time    03/03/13  9:45 PM      Result Value Range   WBC 11.6 (*) 4.0 - 10.5 K/uL   RBC 4.87  4.22 - 5.81 MIL/uL   Hemoglobin 16.3  13.0 - 17.0 g/dL   HCT 29.5  62.1 - 30.8 %   MCV 93.4  78.0 - 100.0 fL   MCH 33.5  26.0 - 34.0 pg   MCHC 35.8  30.0 - 36.0 g/dL   RDW 65.7  84.6 - 96.2 %   Platelets 206  150 - 400 K/uL   Neutrophils Relative % 82 (*) 43 - 77 %   Neutro Abs 9.4 (*) 1.7 - 7.7 K/uL   Lymphocytes Relative 7 (*) 12 - 46 %   Lymphs Abs 0.8  0.7 - 4.0 K/uL   Monocytes Relative 12  3 - 12 %    Monocytes Absolute 1.3 (*) 0.1 - 1.0 K/uL   Eosinophils Relative 0  0 - 5 %   Eosinophils Absolute 0.0  0.0 - 0.7 K/uL   Basophils Relative 0  0 - 1 %   Basophils Absolute 0.0  0.0 - 0.1 K/uL  COMPREHENSIVE METABOLIC PANEL     Status: Abnormal   Collection Time    03/03/13  9:45 PM      Result Value Range   Sodium 137  135 - 145 mEq/L   Potassium 3.9  3.5 - 5.1 mEq/L   Chloride 101  96 - 112 mEq/L   CO2 21  19 - 32 mEq/L   Glucose, Bld 98  70 - 99 mg/dL   BUN 19  6 - 23 mg/dL   Creatinine, Ser 9.52  0.50 - 1.35 mg/dL   Calcium 9.2  8.4 - 84.1 mg/dL   Total Protein 7.0  6.0 - 8.3 g/dL   Albumin 3.7  3.5 - 5.2 g/dL   AST 46 (*) 0 - 37 U/L   ALT 16  0 - 53 U/L   Alkaline Phosphatase 55  39 - 117 U/L   Total Bilirubin 2.5 (*) 0.3 - 1.2 mg/dL   GFR calc non Af Amer 74 (*) >90 mL/min   GFR calc Af Amer 86 (*) >90 mL/min  CK     Status: Abnormal   Collection Time    03/03/13  9:45 PM  Result Value Range   Total CK 1446 (*) 7 - 232 U/L  POCT I-STAT TROPONIN I     Status: None   Collection Time    03/03/13 10:50 PM      Result Value Range   Troponin i, poc 0.03  0.00 - 0.08 ng/mL   Comment 3           URINALYSIS, ROUTINE W REFLEX MICROSCOPIC     Status: Abnormal   Collection Time    03/03/13 10:52 PM      Result Value Range   Color, Urine AMBER (*) YELLOW   APPearance CLEAR  CLEAR   Specific Gravity, Urine 1.021  1.005 - 1.030   pH 5.0  5.0 - 8.0   Glucose, UA NEGATIVE  NEGATIVE mg/dL   Hgb urine dipstick MODERATE (*) NEGATIVE   Bilirubin Urine SMALL (*) NEGATIVE   Ketones, ur 15 (*) NEGATIVE mg/dL   Protein, ur NEGATIVE  NEGATIVE mg/dL   Urobilinogen, UA 1.0  0.0 - 1.0 mg/dL   Nitrite NEGATIVE  NEGATIVE   Leukocytes, UA NEGATIVE  NEGATIVE  URINE MICROSCOPIC-ADD ON     Status: None   Collection Time    03/03/13 10:52 PM      Result Value Range   Squamous Epithelial / LPF RARE  RARE   WBC, UA 0-2  <3 WBC/hpf   RBC / HPF 11-20  <3 RBC/hpf   Bacteria, UA RARE  RARE   CG4 I-STAT (LACTIC ACID)     Status: Abnormal   Collection Time    03/03/13 11:52 PM      Result Value Range   Lactic Acid, Venous 2.41 (*) 0.5 - 2.2 mmol/L  CBC     Status: None   Collection Time    03/04/13  4:25 AM      Result Value Range   WBC 10.1  4.0 - 10.5 K/uL   RBC 4.61  4.22 - 5.81 MIL/uL   Hemoglobin 15.3  13.0 - 17.0 g/dL   HCT 11.9  14.7 - 82.9 %   MCV 93.5  78.0 - 100.0 fL   MCH 33.2  26.0 - 34.0 pg   MCHC 35.5  30.0 - 36.0 g/dL   RDW 56.2  13.0 - 86.5 %   Platelets 204  150 - 400 K/uL  COMPREHENSIVE METABOLIC PANEL     Status: Abnormal   Collection Time    03/04/13  4:25 AM      Result Value Range   Sodium 139  135 - 145 mEq/L   Potassium 3.4 (*) 3.5 - 5.1 mEq/L   Chloride 103  96 - 112 mEq/L   CO2 21  19 - 32 mEq/L   Glucose, Bld 86  70 - 99 mg/dL   BUN 18  6 - 23 mg/dL   Creatinine, Ser 7.84  0.50 - 1.35 mg/dL   Calcium 8.8  8.4 - 69.6 mg/dL   Total Protein 6.3  6.0 - 8.3 g/dL   Albumin 3.3 (*) 3.5 - 5.2 g/dL   AST 47 (*) 0 - 37 U/L   ALT 15  0 - 53 U/L   Alkaline Phosphatase 50  39 - 117 U/L   Total Bilirubin 2.3 (*) 0.3 - 1.2 mg/dL   GFR calc non Af Amer 77 (*) >90 mL/min   GFR calc Af Amer 89 (*) >90 mL/min  TSH     Status: None   Collection Time    03/04/13  4:25 AM  Result Value Range   TSH 3.391  0.350 - 4.500 uIU/mL  CK     Status: Abnormal   Collection Time    03/04/13  4:25 AM      Result Value Range   Total CK 1276 (*) 7 - 232 U/L  BILIRUBIN, DIRECT     Status: Abnormal   Collection Time    03/04/13  8:55 AM      Result Value Range   Bilirubin, Direct 0.5 (*) 0.0 - 0.3 mg/dL   Dg Chest 1 View  16/04/958   CLINICAL DATA:  Dementia, fall.  EXAM: CHEST - 1 VIEW  COMPARISON:  05/03/2004  FINDINGS: Prominent cardiomediastinal contours. Hyperaeration. Mild lung base opacities. Small effusions not excluded. No pneumothorax. Multilevel degenerative change.  IMPRESSION: Prominent cardiac contour, similar to prior.  Hypoaeration with  mild lung base opacities ; atelectasis versus infiltrate.   Electronically Signed   By: Jearld Lesch M.D.   On: 03/03/2013 23:00   Dg Pelvis 1-2 Views  03/03/2013   CLINICAL DATA:  Fall and demented.  EXAM: PELVIS - 1-2 VIEW  COMPARISON:  None.  FINDINGS: Two views of the pelvis. Both are moderate to markedly degraded due to overlying pannus. The femoral heads are located. L5-S1 degenerative disc disease. Sacroiliac joints are grossly symmetric. No displaced fracture identified. Equivocal osseous irregularity about the left femoral neck.  IMPRESSION: Moderate to markedly degraded exam secondary to overlying pannus. No gross acute osseous abnormality. Favor degenerative irregularity about the left femoral neck. Correlate with left-sided symptoms. If symptoms warrant, consider CT.   Electronically Signed   By: Jeronimo Greaves M.D.   On: 03/03/2013 22:50   Ct Head Wo Contrast  03/03/2013   CLINICAL DATA:  Fall.  Demented.  EXAM: CT HEAD WITHOUT CONTRAST  CT CERVICAL SPINE WITHOUT CONTRAST  TECHNIQUE: Multidetector CT imaging of the head and cervical spine was performed following the standard protocol without intravenous contrast. Multiplanar CT image reconstructions of the cervical spine were also generated.  COMPARISON:  None.  FINDINGS: CT HEAD FINDINGS  Sinuses/Soft tissues: Mucosal thickening of the right maxillary sinus. Bilateral antrostomies. Minimal motion degradation. Clear mastoid air cells.  Intracranial: Expected cerebral atrophy. Relatively mild low density in the periventricular white matter likely related to small vessel disease. No mass lesion, hemorrhage, hydrocephalus, acute infarct, intra-axial, or extra-axial fluid collection.  CT CERVICAL SPINE FINDINGS  Spinal visualization through the bottom of T1. Prevertebral soft tissues are within normal limits. Carotid atherosclerosis. No apical pneumothorax. Cerumen within the bilateral external ear canals. Skull base intact. Multilevel spondylosis.  Most advanced at C5-6 and C6-7. Facets are well-aligned. Advanced left greater than right facet arthropathy, including at C3-4 and C4-5.  Coronal reformats demonstrate a normal C1-C2 articulation.  IMPRESSION: 1.  No acute intracranial abnormality. 2. No acute fracture or subluxation within the cervical spine. 3. Advanced spondylosis. Straightening of expected cervical lordosis could be positional, due to muscular spasm, or ligamentous injury.   Electronically Signed   By: Jeronimo Greaves M.D.   On: 03/03/2013 22:29   Ct Cervical Spine Wo Contrast  03/03/2013   CLINICAL DATA:  Fall.  Demented.  EXAM: CT HEAD WITHOUT CONTRAST  CT CERVICAL SPINE WITHOUT CONTRAST  TECHNIQUE: Multidetector CT imaging of the head and cervical spine was performed following the standard protocol without intravenous contrast. Multiplanar CT image reconstructions of the cervical spine were also generated.  COMPARISON:  None.  FINDINGS: CT HEAD FINDINGS  Sinuses/Soft tissues: Mucosal thickening of the right  maxillary sinus. Bilateral antrostomies. Minimal motion degradation. Clear mastoid air cells.  Intracranial: Expected cerebral atrophy. Relatively mild low density in the periventricular white matter likely related to small vessel disease. No mass lesion, hemorrhage, hydrocephalus, acute infarct, intra-axial, or extra-axial fluid collection.  CT CERVICAL SPINE FINDINGS  Spinal visualization through the bottom of T1. Prevertebral soft tissues are within normal limits. Carotid atherosclerosis. No apical pneumothorax. Cerumen within the bilateral external ear canals. Skull base intact. Multilevel spondylosis. Most advanced at C5-6 and C6-7. Facets are well-aligned. Advanced left greater than right facet arthropathy, including at C3-4 and C4-5.  Coronal reformats demonstrate a normal C1-C2 articulation.  IMPRESSION: 1.  No acute intracranial abnormality. 2. No acute fracture or subluxation within the cervical spine. 3. Advanced spondylosis.  Straightening of expected cervical lordosis could be positional, due to muscular spasm, or ligamentous injury.   Electronically Signed   By: Jeronimo Greaves M.D.   On: 03/03/2013 22:29   Ct Pelvis Wo Contrast  03/04/2013   CLINICAL DATA:  Fall it ulna. Demented. Rule out femoral neck fracture.  EXAM: CT PELVIS WITHOUT CONTRAST  TECHNIQUE: Multidetector CT imaging of the pelvis was performed following the standard protocol without intravenous contrast.  COMPARISON:  Plain films of earlier in the day.  FINDINGS: Soft tissues: Normal pelvic small bowel loops, without evidence of free intraperitoneal air or hemorrhage.  No pelvic adenopathy. Multiple bladder diverticula. Mild prostatomegaly. A left inguinal hernia contains fat and minimal descending/sigmoid colon.  No soft tissue hematoma.  Bones: Mild degenerative irregularity of the right sacroiliac joint. No acute fracture or dislocation. Joint spaces maintained. Degenerative disc disease at L4-5 and L3-4.  IMPRESSION: 1. No acute osseous finding. 2. Bladder diverticula, suggesting a component of bladder outlet obstruction. 3. Left inguinal hernia containing fat and minimal nonobstructive sigmoid colon.   Electronically Signed   By: Jeronimo Greaves M.D.   On: 03/04/2013 00:09   Dg Knee Right Port  03/03/2013   CLINICAL DATA:  Fall with right knee pain and abrasion.  EXAM: PORTABLE RIGHT KNEE - 1-2 VIEW  COMPARISON:  None.  FINDINGS: AP and cross-table lateral views. Minimal medial mild patellofemoral joint space narrowing and osteophyte formation. No acute fracture or dislocation. Difficult to exclude a small suprapatellar joint effusion.  IMPRESSION: Possible small suprapatellar joint effusion. No fracture identified. If possible, dedicated four view series should be considered.   Electronically Signed   By: Jeronimo Greaves M.D.   On: 03/03/2013 23:50    Assessment/Plan: Diagnosis: Fall on 03/02/2013 or 03/03/2013 resulting in rhabdomyolysis and decline in  function 1. Does the need for close, 24 hr/day medical supervision in concert with the patient's rehab needs make it unreasonable for this patient to be served in a less intensive setting? No 2. Co-Morbidities requiring supervision/potential complications: Possible aspiration pneumonia 3. Due to bladder management, bowel management, safety and pain management, does the patient require 24 hr/day rehab nursing? Potentially 4. Does the patient require coordinated care of a physician, rehab nurse, PT, OT to address physical and functional deficits in the context of the above medical diagnosis(es)? No Addressing deficits in the following areas: balance, endurance, locomotion, strength, transferring and cognition 5. Can the patient actively participate in an intensive therapy program of at least 3 hrs of therapy per day at least 5 days per week? Potentially 6. The potential for patient to make measurable gains while on inpatient rehab is poor 7. Anticipated functional outcomes upon discharge from inpatient rehab are NA with PT, NA  with OT, NA with SLP. 8. Estimated rehab length of stay to reach the above functional goals is: NA 9. Does the patient have adequate social supports to accommodate these discharge functional goals? Potentially 10. Anticipated D/C setting: Home 11. Anticipated post D/C treatments: HH therapy 12. Overall Rehab/Functional Prognosis: fair  RECOMMENDATIONS: This patient's condition is appropriate for continued rehabilitative care in the following setting: Patient without any severe injuries. If he is not able to regain prior functional level in 1-2 days, then recommend SNF for less intensive rehabilitation program Patient has agreed to participate in recommended program. Potentially Note that insurance prior authorization may be required for reimbursement for recommended care.  Comment: No significant neurologic or orthopedic injury. No significant cardiopulmonary  deconditioning.    03/04/2013

## 2013-03-04 NOTE — ED Provider Notes (Signed)
I saw and evaluated the patient, reviewed the resident's note and I agree with the findings and plan.  EKG Interpretation    Date/Time:  Wednesday March 03 2013 21:37:52 EST Ventricular Rate:  102 PR Interval:  152 QRS Duration: 87 QT Interval:  363 QTC Calculation: 473 R Axis:   6 Text Interpretation:  Sinus tachycardia Probable left atrial enlargement Abnormal R-wave progression, early transition Borderline repolarization abnormality Rate faster since previous  Confirmed by Nathanal Hermiz  MD, Karna Abed [phone]) on 03/03/2013 9:44:16 PM            See my separate note   Richardean Canal, MD 03/04/13 2054

## 2013-03-04 NOTE — Progress Notes (Signed)
PT DID NOT PASS BEDSIDE SWALLOWING EVAL PER ER NURSE CHRIS.

## 2013-03-04 NOTE — Progress Notes (Signed)
Please refer to Dr. Wynn Banker' recommendations. Recommend SNF if pt unable to regain prior functional level over the next one to two days. Not an inpt rehab candidate. [phone]

## 2013-03-04 NOTE — Evaluation (Signed)
Physical Therapy Evaluation Patient Details Name: Anthony [name] MRN: [id number] DOB: 1915-07-09 Today's Date: 03/04/2013 Time: 1434-1500 PT Time Calculation (min): 26 min  PT Assessment / Plan / Recommendation History of Present Illness  Patient admitted with aspiration pneumonia and Rhabdomyolysis secondary to prolonged period on the ground/floor after second fall in 24 hour period.  Patient with urinary retention and significant right knee pain and does not like to put weight through it.  Clinical Impression  Pt admitted with recent falls. Pt currently with functional limitations due to the deficits listed below (see PT Problem List). Pt very sharp when he is able to hear/understand the question. Good safety awareness (monitoring his IV and foley catheter tubing himself). Pt will benefit from skilled PT to increase their independence and safety with mobility to allow discharge to the venue listed below.       PT Assessment  Patient needs continued PT services    Follow Up Recommendations  CIR;Supervision for mobility/OOB    Does the patient have the potential to tolerate intense rehabilitation      Barriers to Discharge Decreased caregiver support      Equipment Recommendations  Rolling walker with 5" wheels    Recommendations for Other Services Rehab consult   Frequency Min 3X/week    Precautions / Restrictions Precautions Precautions: Fall Precaution Comments: Very HOH- right ear is better than left, right knee pain and reluctant to bear weight on RLE Restrictions Weight Bearing Restrictions: No (Rt knee xray negative x ?suprapatellar effusion)   Pertinent Vitals/Pain Unable to rate RLE pain, however cannot tolerate putting weight through RLE  SaO2 on RA on arrival 88% ([place] O2 had slipped out of his nose); with 2L O2 pt remained 90-91% throughout session      Mobility  Bed Mobility Bed Mobility: Not assessed Rolling Right: 5: Supervision Right Sidelying to Sit: 4: Min  assist Sitting - Scoot to Edge of Bed: 4: Min assist Details for Bed Mobility Assistance: pt refused return to bed (up in recliner and states he often sleeps in recliner at home) Transfers Transfers: Sit to Stand;Stand to Sit Sit to Stand: 2: Max assist;With armrests Stand to Sit: 2: Max assist;With armrests Details for Transfer Assistance: vc for safe use of RW/hand placement; pt with difficulty mving Rt hand to RW due to RLE pain Ambulation/Gait Ambulation/Gait Assistance: Other (comment) (unable due to pain and poor use of UEs with RW)    Exercises General Exercises - Lower Extremity Hip Flexion/Marching: AROM;Right;10 reps;Standing   PT Diagnosis: Difficulty walking;Acute pain  PT Problem List: Decreased strength;Decreased range of motion;Decreased activity tolerance;Decreased balance;Decreased mobility;Decreased knowledge of use of DME;Cardiopulmonary status limiting activity;Pain PT Treatment Interventions: DME instruction;Gait training;Functional mobility training;Stair training;Therapeutic activities;Therapeutic exercise;Balance training;Patient/family education     PT Goals(Current goals can be found in the care plan section) Acute Rehab PT Goals Patient Stated Goal: I want to go home PT Goal Formulation: With patient Time For Goal Achievement: 03/11/13 Potential to Achieve Goals: Good  Visit Information  Last PT Received On: 03/04/13 Assistance Needed: +2 (to walk) History of Present Illness: Patient admitted with aspiration pneumonia and Rhabdomyolysis secondary to prolonged period on the ground/floor after second fall in 24 hour period.  Patient with urinary retention and significant right knee pain and does not like to put weight through it.       Prior Functioning  Home Living Family/patient expects to be discharged to:: Inpatient rehab (vs SNF) Living Arrangements: Alone Available Help at Discharge: Family;Available PRN/intermittently Type  of Home: House Home  Equipment: Grab bars - tub/shower;Grab bars - toilet Additional Comments: no family present to obtain information re: stairs Prior Function Level of Independence: Needs assistance;Independent Gait / Transfers Assistance Needed: walked without device; 2 recent falls (one outside on driveway onto Rt knee per pt) Comments: Local daughter reports he has been independent except for recent decline ~10 days.  Daughter has been checking in daily and occasionally providing assistance with IADLs.  Patient with h/o dementia and daughters decribe him as "gruff".  This OT did not see any gruffness during the evaluation Communication Communication: HOH (right ear is better than left) Dominant Hand: Right    Cognition  Cognition Arousal/Alertness: Awake/alert Behavior During Therapy: WFL for tasks assessed/performed Overall Cognitive Status: History of cognitive impairments - at baseline (when he hears you, very appropriate responses)    Extremity/Trunk Assessment Upper Extremity Assessment Upper Extremity Assessment: Defer to OT evaluation Lower Extremity Assessment Lower Extremity Assessment: RLE deficits/detail RLE Deficits / Details: bandage to Rt knee; pt will flex to 80 at edge of chair; self-selects to be TDWB at most due to pain RLE: Unable to fully assess due to pain Cervical / Trunk Assessment Cervical / Trunk Assessment: Normal   Balance Balance Balance Assessed: Yes Static Sitting Balance Static Sitting - Balance Support: Bilateral upper extremity supported;Feet supported Static Sitting - Level of Assistance: 6: Modified independent (Device/Increase time) Static Sitting - Comment/# of Minutes: edge of chair Static Standing Balance Static Standing - Balance Support: Bilateral upper extremity supported Static Standing - Level of Assistance: 3: Mod assist Static Standing - Comment/# of Minutes: leans to Rt  Dynamic Standing Balance Dynamic Standing - Balance Support: Bilateral upper  extremity supported Dynamic Standing - Level of Assistance: 3: Mod assist Dynamic Standing - Balance Activities: Other (comment) (Rt hip/knee flexion) Dynamic Standing - Comments: pt stood total of 4 minutes with education for use of UEs to offload RLE and attempt stepping with LLE; pt unable to coordinate  End of Session PT - End of Session Equipment Utilized During Treatment: Gait belt Activity Tolerance: Patient limited by pain Patient left: in chair;with call bell/phone within reach Nurse Communication: Mobility status;Other (comment) (prefers recliner to bed (as at his home))  GP     Narciso Stoutenburg 03/04/2013, 3:16 PM Pager [phone]

## 2013-03-04 NOTE — Care Management Note (Signed)
    Page 1 of 1   03/08/2013     5:11:06 PM   CARE MANAGEMENT NOTE 03/08/2013  Patient:  Anthony [name], Anthony [name]   Account Number:  [id number]  Date Initiated:  03/04/2013  Documentation initiated by:  Avary Pitsenbarger  Subjective/Objective Assessment:   PT ADM ON 12/3 AFTER FALL; FOUND DOWN AFTER 10 HRS; ASPIRATION PNA.  PTA, PT LIVES AT HOME ALONE AND WAS INDEPENDENT.  PT HAS SUPPORTIVE FAMILY.     Action/Plan:   PT/OT RECOMMENDING CIR VS SNF AT DC.  MD PLEASE ORDER REHAB CONSULT.  CSW FOLLOWING FOR BACKUP PLAN IF REHAB NOT AN OPTION   Anticipated DC Date:  03/08/2013   Anticipated DC Plan:  SKILLED NURSING FACILITY  In-house referral  Clinical Social Worker         Choice offered to / List presented to:             Status of service:  Completed, signed off Medicare Important Message given?   (If response is "NO", the following Medicare IM given date fields will be blank) Date Medicare IM given:   Date Additional Medicare IM given:    Discharge Disposition:  SKILLED NURSING FACILITY  Per UR Regulation:  Reviewed for med. necessity/level of care/duration of stay  If discussed at Long Length of Stay Meetings, dates discussed:    Comments:  03/08/13 Morrill Bomkamp,RN,BSN [phone] PT FOR DC TO SNF TODAY, PER CSW ARRANGEMENTS.  03/04/13 Gauge Winski,RN,BSN [phone] SPOKE WITH DAUGHTER, JILL AT BEDSIDE:  PT INDEPENDENT UP UNTIL THIS EPISODE.  PT/OT RECOMMENDING CIR--WOULD BENEFIT FROM REHAB CONSULT.  CSW TO FOLLOW.  DAUGHTER IS OPEN TO SNF, IF PT NEEDS THIS FOR REHAB.

## 2013-03-04 NOTE — Progress Notes (Signed)
Subjective: Hx reviewed with patient and 2 daughters.  Apparently fell twice within 1 day period.  Spent at least 10 hours on the ground, too weak to get up before EMS was called.  Only complaint today is that right knee is painful.  Patient had urinary retention requiring foley catheter placement with 675cc output.  Failed swallow evaluation.  Objective: Vital signs in last 24 hours: Temp:  [98.1 F (36.7 C)-99.7 F (37.6 C)] 98.1 F (36.7 C) (12/04 0508) Pulse Rate:  [89-108] 89 (12/04 0508) Resp:  [16-24] 16 (12/04 0508) BP: (120-152)/(68-75) 120/71 mmHg (12/04 0508) SpO2:  [90 %-95 %] 90 % (12/04 0508) Weight:  [93.7 kg (206 lb 9.1 oz)] 93.7 kg (206 lb 9.1 oz) (12/04 0118) Weight change:     CBG (last 3)  No results found for this basename: GLUCAP,  in the last 72 hours  Intake/Output from previous day: 12/03 0701 - 12/04 0700 In: 500 [I.V.:500] Out: 1050 [Urine:1050] Intake/Output this shift:    General appearance: alert and mildly disoriented but appropriate; extremely hard of hearing Eyes: no scleral icterus Throat: oropharynx moist without erythema Resp: clear to auscultation bilaterally Cardio: regular rate and rhythm GI: soft, non-tender; bowel sounds normal; no masses,  no organomegaly Extremities: no clubbing, cyanosis or edema; right knee abrasion Neurologic: no focal weakness or cranial nerve deficits   Lab Results:  Recent Labs  03/03/13 2145 03/04/13 0425  NA 137 139  K 3.9 3.4*  CL 101 103  CO2 21 21  GLUCOSE 98 86  BUN 19 18  CREATININE 0.80 0.72  CALCIUM 9.2 8.8    Recent Labs  03/03/13 2145 03/04/13 0425  AST 46* 47*  ALT 16 15  ALKPHOS 55 50  BILITOT 2.5* 2.3*  PROT 7.0 6.3  ALBUMIN 3.7 3.3*    Recent Labs  03/03/13 2145 03/04/13 0425  WBC 11.6* 10.1  NEUTROABS 9.4*  --   HGB 16.3 15.3  HCT 45.5 43.1  MCV 93.4 93.5  PLT 206 204   No results found for this basename: INR, PROTIME    Recent Labs  03/03/13 2145  03/04/13 0425  CKTOTAL 1446* 1276*   No results found for this basename: TSH, T4TOTAL, FREET3, T3FREE, THYROIDAB,  in the last 72 hours No results found for this basename: VITAMINB12, FOLATE, FERRITIN, TIBC, IRON, RETICCTPCT,  in the last 72 hours  Studies/Results: Dg Chest 1 View  03/03/2013   CLINICAL DATA:  Dementia, fall.  EXAM: CHEST - 1 VIEW  COMPARISON:  05/03/2004  FINDINGS: Prominent cardiomediastinal contours. Hyperaeration. Mild lung base opacities. Small effusions not excluded. No pneumothorax. Multilevel degenerative change.  IMPRESSION: Prominent cardiac contour, similar to prior.  Hypoaeration with mild lung base opacities ; atelectasis versus infiltrate.   Electronically Signed   By: Jearld Lesch M.D.   On: 03/03/2013 23:00   Dg Pelvis 1-2 Views  03/03/2013   CLINICAL DATA:  Fall and demented.  EXAM: PELVIS - 1-2 VIEW  COMPARISON:  None.  FINDINGS: Two views of the pelvis. Both are moderate to markedly degraded due to overlying pannus. The femoral heads are located. L5-S1 degenerative disc disease. Sacroiliac joints are grossly symmetric. No displaced fracture identified. Equivocal osseous irregularity about the left femoral neck.  IMPRESSION: Moderate to markedly degraded exam secondary to overlying pannus. No gross acute osseous abnormality. Favor degenerative irregularity about the left femoral neck. Correlate with left-sided symptoms. If symptoms warrant, consider CT.   Electronically Signed   By: Hosie Spangle.D.  On: 03/03/2013 22:50   Ct Head Wo Contrast  03/03/2013   CLINICAL DATA:  Fall.  Demented.  EXAM: CT HEAD WITHOUT CONTRAST  CT CERVICAL SPINE WITHOUT CONTRAST  TECHNIQUE: Multidetector CT imaging of the head and cervical spine was performed following the standard protocol without intravenous contrast. Multiplanar CT image reconstructions of the cervical spine were also generated.  COMPARISON:  None.  FINDINGS: CT HEAD FINDINGS  Sinuses/Soft tissues: Mucosal  thickening of the right maxillary sinus. Bilateral antrostomies. Minimal motion degradation. Clear mastoid air cells.  Intracranial: Expected cerebral atrophy. Relatively mild low density in the periventricular white matter likely related to small vessel disease. No mass lesion, hemorrhage, hydrocephalus, acute infarct, intra-axial, or extra-axial fluid collection.  CT CERVICAL SPINE FINDINGS  Spinal visualization through the bottom of T1. Prevertebral soft tissues are within normal limits. Carotid atherosclerosis. No apical pneumothorax. Cerumen within the bilateral external ear canals. Skull base intact. Multilevel spondylosis. Most advanced at C5-6 and C6-7. Facets are well-aligned. Advanced left greater than right facet arthropathy, including at C3-4 and C4-5.  Coronal reformats demonstrate a normal C1-C2 articulation.  IMPRESSION: 1.  No acute intracranial abnormality. 2. No acute fracture or subluxation within the cervical spine. 3. Advanced spondylosis. Straightening of expected cervical lordosis could be positional, due to muscular spasm, or ligamentous injury.   Electronically Signed   By: Jeronimo Greaves M.D.   On: 03/03/2013 22:29   Ct Cervical Spine Wo Contrast  03/03/2013   CLINICAL DATA:  Fall.  Demented.  EXAM: CT HEAD WITHOUT CONTRAST  CT CERVICAL SPINE WITHOUT CONTRAST  TECHNIQUE: Multidetector CT imaging of the head and cervical spine was performed following the standard protocol without intravenous contrast. Multiplanar CT image reconstructions of the cervical spine were also generated.  COMPARISON:  None.  FINDINGS: CT HEAD FINDINGS  Sinuses/Soft tissues: Mucosal thickening of the right maxillary sinus. Bilateral antrostomies. Minimal motion degradation. Clear mastoid air cells.  Intracranial: Expected cerebral atrophy. Relatively mild low density in the periventricular white matter likely related to small vessel disease. No mass lesion, hemorrhage, hydrocephalus, acute infarct, intra-axial, or  extra-axial fluid collection.  CT CERVICAL SPINE FINDINGS  Spinal visualization through the bottom of T1. Prevertebral soft tissues are within normal limits. Carotid atherosclerosis. No apical pneumothorax. Cerumen within the bilateral external ear canals. Skull base intact. Multilevel spondylosis. Most advanced at C5-6 and C6-7. Facets are well-aligned. Advanced left greater than right facet arthropathy, including at C3-4 and C4-5.  Coronal reformats demonstrate a normal C1-C2 articulation.  IMPRESSION: 1.  No acute intracranial abnormality. 2. No acute fracture or subluxation within the cervical spine. 3. Advanced spondylosis. Straightening of expected cervical lordosis could be positional, due to muscular spasm, or ligamentous injury.   Electronically Signed   By: Jeronimo Greaves M.D.   On: 03/03/2013 22:29   Ct Pelvis Wo Contrast  03/04/2013   CLINICAL DATA:  Fall it ulna. Demented. Rule out femoral neck fracture.  EXAM: CT PELVIS WITHOUT CONTRAST  TECHNIQUE: Multidetector CT imaging of the pelvis was performed following the standard protocol without intravenous contrast.  COMPARISON:  Plain films of earlier in the day.  FINDINGS: Soft tissues: Normal pelvic small bowel loops, without evidence of free intraperitoneal air or hemorrhage.  No pelvic adenopathy. Multiple bladder diverticula. Mild prostatomegaly. A left inguinal hernia contains fat and minimal descending/sigmoid colon.  No soft tissue hematoma.  Bones: Mild degenerative irregularity of the right sacroiliac joint. No acute fracture or dislocation. Joint spaces maintained. Degenerative disc disease at L4-5 and  L3-4.  IMPRESSION: 1. No acute osseous finding. 2. Bladder diverticula, suggesting a component of bladder outlet obstruction. 3. Left inguinal hernia containing fat and minimal nonobstructive sigmoid colon.   Electronically Signed   By: Jeronimo Greaves M.D.   On: 03/04/2013 00:09   Dg Knee Right Port  03/03/2013   CLINICAL DATA:  Fall with right  knee pain and abrasion.  EXAM: PORTABLE RIGHT KNEE - 1-2 VIEW  COMPARISON:  None.  FINDINGS: AP and cross-table lateral views. Minimal medial mild patellofemoral joint space narrowing and osteophyte formation. No acute fracture or dislocation. Difficult to exclude a small suprapatellar joint effusion.  IMPRESSION: Possible small suprapatellar joint effusion. No fracture identified. If possible, dedicated four view series should be considered.   Electronically Signed   By: Jeronimo Greaves M.D.   On: 03/03/2013 23:50     Medications: Scheduled: . docusate sodium  100 mg Oral BID  . enoxaparin (LOVENOX) injection  40 mg Subcutaneous Daily  . levofloxacin  500 mg Oral QHS   Continuous: . 0.9 % NaCl with KCl 20 mEq / L 75 mL/hr at 03/04/13 0230    Assessment/Plan: Active Problems: 1.  Rhabdomyolysis- secondary to prolonged period on floor.  Continue IV fluid hydration.  Monitor CK and renal function. 2. Aspiration pneumonia- possible aspiration pneumonitis- continue Levaquin, O2 as needed.  Speech therapy evaluation today. 3. Recurrent falls/generalized weakness- apparently fell at least twice in 24 hour period.  No focal neurologic deficits to indicate CVA or to warrant additional imaging.  Suspect generalized weakness/FTT is underlying cause.  PT/OT evaluations.  May need SNF rehab prior to return home. 4. Urinary retention- add Flomax.  Consider voiding trial tomorrow.  No prior issues per daughter. 5. Hyperbilirubinemia- check direct/indirect bilirubin and monitor. 6. Disposition- pending PT/OT recommendations.  May need SNF rehab vs. 24 hour assistance.  Supportive daughters have been checking on him daily and preparing meals but has been living independently prior to admission.   LOS: 1 day   SHAW,W DOUGLAS 03/04/2013, 7:33 AM

## 2013-03-04 NOTE — Progress Notes (Signed)
Rehab Admissions Coordinator Note:  Patient was screened by Clois Dupes for appropriateness for an Inpatient Acute Rehab Consult. O.T. Recommends an inpt rehab consult.  At this time, we are recommending Inpatient Rehab consult.  Clois Dupes 03/04/2013, 11:58 AM  I can be reached at [phone].

## 2013-03-04 NOTE — Progress Notes (Signed)
Clinical Social Work Department CLINICAL SOCIAL WORK PLACEMENT NOTE 03/04/2013  Patient:  RAYYAN, BURLEY  Account Number:  [id number] Admit date:  03/03/2013  Clinical Social Worker:  Carren Rang  Date/time:  03/04/2013 01:49 PM  Clinical Social Work is seeking post-discharge placement for this patient at the following level of care:   SKILLED NURSING   (*CSW will update this form in Epic as items are completed)   03/04/2013  Patient/family provided with [facility] Department of Clinical Social Work's list of facilities offering this level of care within the geographic area requested by the patient (or if unable, by the patient's family).  03/04/2013  Patient/family informed of their freedom to choose among providers that offer the needed level of care, that participate in Medicare, Medicaid or managed care program needed by the patient, have an available bed and are willing to accept the patient.  03/04/2013  Patient/family informed of [facility]' ownership interest in [facility], as well as of the fact that they are under no obligation to receive care at this facility.  PASARR submitted to EDS on 03/04/2013 PASARR number received from EDS on 03/04/2013  FL2 transmitted to all facilities in geographic area requested by pt/family on  03/04/2013 FL2 transmitted to all facilities within larger geographic area on   Patient informed that his/her managed care company has contracts with or will negotiate with  certain facilities, including the following:     Patient/family informed of bed offers received:   Patient chooses bed at  Physician recommends and patient chooses bed at    Patient to be transferred to  on   Patient to be transferred to facility by   The following physician request were entered in Epic:   Additional Comments:  Maree Krabbe, MSW, [employer] [phone]

## 2013-03-04 NOTE — Progress Notes (Signed)
Utilization review completed. Shoua Ulloa, RN, BSN. 

## 2013-03-04 NOTE — Progress Notes (Signed)
Clinical Social Work Department BRIEF PSYCHOSOCIAL ASSESSMENT 03/04/2013  Patient:  Anthony [name], Anthony [name]     Account Number:  [id number]     Admit date:  03/03/2013  Clinical Social Worker:  Carren Rang  Date/Time:  03/04/2013 01:43 PM  Referred by:  RN  Date Referred:  03/04/2013 Referred for  SNF Placement   Other Referral:   Interview type:  Other - See comment Other interview type:   CSW spoke to patient's daughter at bedside    PSYCHOSOCIAL DATA Living Status:  ALONE Admitted from facility:   Level of care:   Primary support name:  Anthony [name] Primary support relationship to patient:  CHILD, ADULT Degree of support available:   Good; sister Anthony [name] is POA    CURRENT CONCERNS Current Concerns  Post-Acute Placement   Other Concerns:    SOCIAL WORK ASSESSMENT / PLAN Clinical Social Worker received referral from RN that patient's daughters are interested SNF placement at d/c for patient. CSW introduced self and explained reason for visit. Per chart and h&p, patient is hard of hearing and has dementia. Patient's daughter Anthony [name] was in the room by bedside and informed other sister the social worker was in the room.  Patient's daughter reported her and her sister are agreeable for SNF placement as a backup plan to CIR and prefer [facility]. CSW encouraged patient's family to speak to patient about short term snf placement. CSW will complete FL2 for MD's signature and will update patient and family when bed offers are received.   Assessment/plan status:  Psychosocial Support/Ongoing Assessment of Needs Other assessment/ plan:   Information/referral to community resources:   SNF packet/ CSW information    PATIENT'S/FAMILY'S RESPONSE TO PLAN OF CARE: Patient's daughters are interested in short term rehab for patient.       Anthony [name], MSW, Anthony [name] [phone]

## 2013-03-04 NOTE — Evaluation (Signed)
Occupational Therapy Evaluation Patient Details Name: Anthony [name] MRN: [id number] DOB: [birth year] Today's Date: 03/04/2013 Time: 4098-1191 OT Time Calculation (min): 73 min  OT Assessment / Plan / Recommendation History of present illness Patient admitted with aspiration pneumonia and Rhabdomyolysis secondary to prolonged period on the ground/floor after second fall in 24 hour period.  Patient with urinary retention and significant right knee pain and does not like to put weight through it.   Clinical Impression   Pt admitted with above. Pt currently with functional limitations due to the deficits listed below (see OT Problem List).  Pt will benefit from skilled OT to increase their safety and independence with ADL and functional mobility for ADL to facilitate discharge to venue listed below.      OT Assessment  Patient needs continued OT Services    Follow Up Recommendations  CIR;Supervision/Assistance - 24 hour;SNF    Barriers to Discharge Decreased caregiver support 2 supportive daughters one lives in [place] and the other lives in [place].  Local daughter has been checking in on him daily.  Equipment Recommendations  3 in 1 bedside comode;Tub/shower seat    Recommendations for Other Services Rehab consult  Frequency  Min 2X/week    Precautions / Restrictions Precautions Precautions: Fall Precaution Comments: Very HOH- right ear is better than left, right knee pain and reluctant to bear weight on RLE Restrictions Weight Bearing Restrictions: No   Pertinent Vitals/Pain O2 sats down with activity to 84% before pursed lip breathing to bring numbers up.  Right knee pain, not rated yet facial grimace and reluctance to put weight through RLE indicate ~8/10 pain.  Repositioned and RN aware.    ADL  Eating/Feeding: NPO Grooming: Performed;Wash/dry hands;Moderate assistance Where Assessed - Grooming: Supported standing Upper Body Bathing: Simulated;Set up Where Assessed - Upper  Body Bathing: Supported sitting Lower Body Bathing: Simulated;+1 Total assistance Where Assessed - Lower Body Bathing: Supported sitting;Supported sit to stand;Supported standing Upper Body Dressing: Simulated;Minimal assistance Where Assessed - Upper Body Dressing: Supported sitting Lower Body Dressing: Simulated;Performed;+1 Total assistance Where Assessed - Lower Body Dressing: Supported sitting;Supported sit to stand;Supported standing Toilet Transfer: Performed;+2 Total assistance (bed><recliner) Statistician: Patient Percentage: 50% Statistician Method: Landscape architect and Hygiene: Simulated;+2 Total assistance Toileting - Architect and Hygiene: Patient Percentage: 50% Where Assessed - Toileting Clothing Manipulation and Hygiene: Standing Transfers/Ambulation Related to ADLs: Patient favoring his painful RLE during total +2 squat pivot transfer and with sit><stand at sink.  Patient relied heavily on sink to stand with Total assist.   ADL Comments: Patient unable to unweight one hand in standing to wash or dry both hands at same time, able to remove gripper socks however unable to donn them due to right knee pain.    OT Diagnosis: Generalized weakness;Acute pain (h/o dementia)  OT Problem List: Decreased strength;Decreased activity tolerance;Impaired balance (sitting and/or standing);Decreased cognition;Decreased safety awareness;Decreased knowledge of use of DME or AE;Cardiopulmonary status limiting activity;Pain OT Treatment Interventions: Self-care/ADL training;Energy conservation;DME and/or AE instruction;Therapeutic activities;Patient/family education;Balance training   OT Goals(Current goals can be found in the care plan section) Acute Rehab OT Goals Patient Stated Goal: I want to go home OT Goal Formulation: With patient/family Time For Goal Achievement: 03/18/13 Potential to Achieve Goals: Good  Visit Information  Last OT  Received On: 03/04/13 Assistance Needed: +2 (for transfers & mobility) History of Present Illness: Patient admitted with aspiration pneumonia and Rhabdomyolysis secondary to prolonged period on the ground/floor after second fall in 24  hour period.  Patient with urinary retention and significant right knee pain and does not like to put weight through it.       Prior Functioning     Home Living Family/patient expects to be discharged to:: Inpatient rehab (vs SNF) Living Arrangements: Alone Available Help at Discharge: Family;Available PRN/intermittently Type of Home: House Home Equipment: Grab bars - tub/shower;Grab bars - toilet Additional Comments: per daughter, Has a walk in shower with zero entry, no curtain or door, no bathroom DME except he has installed several grab bars throughout bathroom. Prior Function Level of Independence: Needs assistance;Independent Comments: Local daughter reports he has been independent except for recent decline ~10 days.  Daughter has been checking in daily and occasionally providing assistance with IADLs.  Patient with h/o dementia and daughters decribe him as "gruff".  This OT did not see any gruffness during the evaluation Communication Communication: HOH (right ear is better than left) Dominant Hand: Right     Vision/Perception Vision - History Baseline Vision: No visual deficits (per daughter-patient has glasses and does not wear them) Patient Visual Report: No change from baseline Vision - Assessment Additional Comments: patient able to read number on hand held pulse ox   Cognition  Cognition Arousal/Alertness: Awake/alert Behavior During Therapy: WFL for tasks assessed/performed Overall Cognitive Status: History of cognitive impairments - at baseline [facility] for tasks assessed)    Extremity/Trunk Assessment Upper Extremity Assessment Upper Extremity Assessment: Overall WFL for tasks assessed Lower Extremity Assessment Lower Extremity  Assessment: Defer to PT evaluation Cervical / Trunk Assessment Cervical / Trunk Assessment: Normal     Mobility Bed Mobility Bed Mobility: Rolling Right;Right Sidelying to Sit;Sitting - Scoot to Edge of Bed Rolling Right: 5: Supervision Right Sidelying to Sit: 4: Min assist Sitting - Scoot to Edge of Bed: 4: Min assist Transfers Transfers: Sit to Stand;Stand to Sit Sit to Stand: 1: +1 Total assist Stand to Sit: 2: Max assist Details for Transfer Assistance: cues for hand placement.  Often wants to do things his way.     End of Session OT - End of Session Equipment Utilized During Treatment: Gait belt;Oxygen Activity Tolerance: Patient limited by pain;Patient limited by fatigue Patient left: in chair;with call bell/phone within reach;with family/visitor present Nurse Communication: Patient requests pain meds  GO     Kashaun Bebo 03/04/2013, 11:39 AM

## 2013-03-05 ENCOUNTER — Inpatient Hospital Stay (HOSPITAL_COMMUNITY): Payer: Medicare Other

## 2013-03-05 LAB — COMPREHENSIVE METABOLIC PANEL
AST: 47 U/L — ABNORMAL HIGH (ref 0–37)
Albumin: 3.1 g/dL — ABNORMAL LOW (ref 3.5–5.2)
Alkaline Phosphatase: 49 U/L (ref 39–117)
BUN: 16 mg/dL (ref 6–23)
Chloride: 103 mEq/L (ref 96–112)
Glucose, Bld: 95 mg/dL (ref 70–99)
Potassium: 3.9 mEq/L (ref 3.5–5.1)
Total Bilirubin: 1.4 mg/dL — ABNORMAL HIGH (ref 0.3–1.2)
Total Protein: 6.2 g/dL (ref 6.0–8.3)

## 2013-03-05 LAB — CBC
HCT: 41.9 % (ref 39.0–52.0)
Hemoglobin: 14.5 g/dL (ref 13.0–17.0)
MCHC: 34.6 g/dL (ref 30.0–36.0)
Platelets: 185 10*3/uL (ref 150–400)
RDW: 14.1 % (ref 11.5–15.5)
WBC: 11 10*3/uL — ABNORMAL HIGH (ref 4.0–10.5)

## 2013-03-05 LAB — CK: Total CK: 702 U/L — ABNORMAL HIGH (ref 7–232)

## 2013-03-05 NOTE — Progress Notes (Signed)
Physical Therapy Treatment Patient Details Name: Anthony [name] MRN: [id number] DOB: Nov 19, 1915 Today's Date: 03/05/2013 Time: 4098-1191 PT Time Calculation (min): 41 min  PT Assessment / Plan / Recommendation  History of Present Illness Patient admitted with aspiration pneumonia and Rhabdomyolysis secondary to prolonged period on the ground/floor after second fall in 24 hour period.  Patient with urinary retention and significant right knee pain and does not like to put weight through it.   PT Comments   Pt slightly more confused today and with incr wheezing. He was able to fully participate with therapy and bear more weight on RLE.   Follow Up Recommendations  SNF     Does the patient have the potential to tolerate intense rehabilitation     Barriers to Discharge        Equipment Recommendations  Rolling walker with 5" wheels    Recommendations for Other Services    Frequency Min 3X/week   Progress towards PT Goals Progress towards PT goals: Progressing toward goals  Plan Discharge plan needs to be updated    Precautions / Restrictions Precautions Precautions: Fall Precaution Comments: Very HOH- right ear is better than left, right knee pain and reluctant to bear weight on RLE   Pertinent Vitals/Pain Rt knee "a little better" per pt    Mobility  Bed Mobility Bed Mobility: Supine to Sit;Sitting - Scoot to Edge of Bed Supine to Sit: 4: Min guard;HOB elevated Sitting - Scoot to [place] of Bed: 4: Min guard Details for Bed Mobility Assistance: incr effort and time, however ultimately did not need physical assist Transfers Transfers: Sit to Stand;Stand to Sit Sit to Stand: 3: Mod assist;With upper extremity assist Stand to Sit: 3: Mod assist;With upper extremity assist Details for Transfer Assistance: x 2; pt with one hand on RW (anchored by PT) from bed; used bil armrests from chair Ambulation/Gait Ambulation/Gait Assistance: 3: Mod assist Ambulation Distance (Feet): 2  Feet Assistive device: Rolling walker Ambulation/Gait Assistance Details: verbal and tactile cues (due to [facility]) for sequencing; pt able to bear some weight thru RLE; decr safety with pt trying to reach for window handle/window sill Gait Pattern: Step-to pattern;Decreased weight shift to right;Antalgic    Exercises General Exercises - Lower Extremity Ankle Circles/Pumps: AROM;Both;15 reps;Supine Long Arc Quad: AROM;Right;Left;10 reps;Seated Heel Slides: AAROM;AROM;Strengthening;Right;Left;5 reps;Supine (resisted extension)   PT Diagnosis:    PT Problem List:   PT Treatment Interventions:     PT Goals (current goals can now be found in the care plan section)    Visit Information  Last PT Received On: 03/05/13 Assistance Needed: +2 (to walk) History of Present Illness: Patient admitted with aspiration pneumonia and Rhabdomyolysis secondary to prolonged period on the ground/floor after second fall in 24 hour period.  Patient with urinary retention and significant right knee pain and does not like to put weight through it.    Subjective Data      Cognition  Cognition Arousal/Alertness: Awake/alert Behavior During Therapy: WFL for tasks assessed/performed Overall Cognitive Status: Impaired/Different from baseline Area of Impairment: Memory;Safety/judgement;Problem solving Memory: Decreased short-term memory Safety/Judgement: Decreased awareness of safety Problem Solving: Requires verbal cues;Requires tactile cues General Comments: repeating his story of how he fell and injured his knee every few minutes during session; reaching hand off RW to hold onto window sill or handle on window (a bit more confused than 12/4)    Balance     End of Session PT - End of Session Equipment Utilized During Treatment: Gait belt;Oxygen Activity Tolerance:  Patient limited by pain;Treatment limited secondary to medical complications (Comment) (wheezing and dyspnea) Patient left: in chair;with call  bell/phone within reach Nurse Communication: Mobility status   GP     Malaia Buchta 03/05/2013, 4:03 PM Pager [phone]

## 2013-03-05 NOTE — Progress Notes (Signed)
CSW spoke with the daughters and the Pt about facilities. Daughter Avon Gully has paperwork to place on the chart to assist with making placement decisions.   CSW will follow for d/c planning.   CSW will provide offers for placement when medically stable.     [name] [facility]  [address] 1[address];  [phone] Phone: [phone]

## 2013-03-05 NOTE — Progress Notes (Signed)
Subjective: Very confused last night- trying to get out of bed.  Sleeping soundly this am.  Daughters at bedside.  No new concerns.  Objective: Vital signs in last 24 hours: Temp:  [98 F (36.7 C)-98.1 F (36.7 C)] 98.1 F (36.7 C) (12/05 0657) Pulse Rate:  [94-119] 94 (12/05 0657) Resp:  [18] 18 (12/05 0657) BP: (119-153)/(72-78) 153/78 mmHg (12/05 0657) SpO2:  [84 %-95 %] 93 % (12/05 0657) Weight:  [93.7 kg (206 lb 9.1 oz)] 93.7 kg (206 lb 9.1 oz) (12/05 0657) Weight change: 0 kg (0 lb)    CBG (last 3)  No results found for this basename: GLUCAP,  in the last 72 hours  Intake/Output from previous day: 12/04 0701 - 12/05 0700 In: 360 [P.O.:360] Out: 1700 [Urine:1700] Intake/Output this shift:    General appearance: somnolent, grimaces to noxious stimulus but will not awaken Eyes: no scleral icterus Throat: oropharynx moist without erythema Resp: clear to auscultation bilaterally Cardio: regular rate and rhythm GI: soft, non-tender; bowel sounds normal; no masses,  no organomegaly Extremities: no clubbing, cyanosis or edema   Lab Results:  Recent Labs  03/04/13 0425 03/05/13 0359  NA 139 137  K 3.4* 3.9  CL 103 103  CO2 21 23  GLUCOSE 86 95  BUN 18 16  CREATININE 0.72 0.72  CALCIUM 8.8 8.5    Recent Labs  03/04/13 0425 03/05/13 0359  AST 47* 47*  ALT 15 16  ALKPHOS 50 49  BILITOT 2.3* 1.4*  PROT 6.3 6.2  ALBUMIN 3.3* 3.1*    Recent Labs  03/03/13 2145 03/04/13 0425 03/05/13 0359  WBC 11.6* 10.1 11.0*  NEUTROABS 9.4*  --   --   HGB 16.3 15.3 14.5  HCT 45.5 43.1 41.9  MCV 93.4 93.5 94.8  PLT 206 204 185   No results found for this basename: INR, PROTIME    Recent Labs  03/03/13 2145 03/04/13 0425 03/05/13 0359  CKTOTAL 1446* 1276* 702*    Recent Labs  03/04/13 0425  TSH 3.391    Studies/Results: Dg Chest 1 View  03/03/2013   CLINICAL DATA:  Dementia, fall.  EXAM: CHEST - 1 VIEW  COMPARISON:  05/03/2004  FINDINGS: Prominent  cardiomediastinal contours. Hyperaeration. Mild lung base opacities. Small effusions not excluded. No pneumothorax. Multilevel degenerative change.  IMPRESSION: Prominent cardiac contour, similar to prior.  Hypoaeration with mild lung base opacities ; atelectasis versus infiltrate.   Electronically Signed   By: Jearld Lesch M.D.   On: 03/03/2013 23:00   Dg Pelvis 1-2 Views  03/03/2013   CLINICAL DATA:  Fall and demented.  EXAM: PELVIS - 1-2 VIEW  COMPARISON:  None.  FINDINGS: Two views of the pelvis. Both are moderate to markedly degraded due to overlying pannus. The femoral heads are located. L5-S1 degenerative disc disease. Sacroiliac joints are grossly symmetric. No displaced fracture identified. Equivocal osseous irregularity about the left femoral neck.  IMPRESSION: Moderate to markedly degraded exam secondary to overlying pannus. No gross acute osseous abnormality. Favor degenerative irregularity about the left femoral neck. Correlate with left-sided symptoms. If symptoms warrant, consider CT.   Electronically Signed   By: Jeronimo Greaves M.D.   On: 03/03/2013 22:50   Ct Head Wo Contrast  03/03/2013   CLINICAL DATA:  Fall.  Demented.  EXAM: CT HEAD WITHOUT CONTRAST  CT CERVICAL SPINE WITHOUT CONTRAST  TECHNIQUE: Multidetector CT imaging of the head and cervical spine was performed following the standard protocol without intravenous contrast. Multiplanar CT image reconstructions  of the cervical spine were also generated.  COMPARISON:  None.  FINDINGS: CT HEAD FINDINGS  Sinuses/Soft tissues: Mucosal thickening of the right maxillary sinus. Bilateral antrostomies. Minimal motion degradation. Clear mastoid air cells.  Intracranial: Expected cerebral atrophy. Relatively mild low density in the periventricular white matter likely related to small vessel disease. No mass lesion, hemorrhage, hydrocephalus, acute infarct, intra-axial, or extra-axial fluid collection.  CT CERVICAL SPINE FINDINGS  Spinal  visualization through the bottom of T1. Prevertebral soft tissues are within normal limits. Carotid atherosclerosis. No apical pneumothorax. Cerumen within the bilateral external ear canals. Skull base intact. Multilevel spondylosis. Most advanced at C5-6 and C6-7. Facets are well-aligned. Advanced left greater than right facet arthropathy, including at C3-4 and C4-5.  Coronal reformats demonstrate a normal C1-C2 articulation.  IMPRESSION: 1.  No acute intracranial abnormality. 2. No acute fracture or subluxation within the cervical spine. 3. Advanced spondylosis. Straightening of expected cervical lordosis could be positional, due to muscular spasm, or ligamentous injury.   Electronically Signed   By: Jeronimo Greaves M.D.   On: 03/03/2013 22:29   Ct Cervical Spine Wo Contrast  03/03/2013   CLINICAL DATA:  Fall.  Demented.  EXAM: CT HEAD WITHOUT CONTRAST  CT CERVICAL SPINE WITHOUT CONTRAST  TECHNIQUE: Multidetector CT imaging of the head and cervical spine was performed following the standard protocol without intravenous contrast. Multiplanar CT image reconstructions of the cervical spine were also generated.  COMPARISON:  None.  FINDINGS: CT HEAD FINDINGS  Sinuses/Soft tissues: Mucosal thickening of the right maxillary sinus. Bilateral antrostomies. Minimal motion degradation. Clear mastoid air cells.  Intracranial: Expected cerebral atrophy. Relatively mild low density in the periventricular white matter likely related to small vessel disease. No mass lesion, hemorrhage, hydrocephalus, acute infarct, intra-axial, or extra-axial fluid collection.  CT CERVICAL SPINE FINDINGS  Spinal visualization through the bottom of T1. Prevertebral soft tissues are within normal limits. Carotid atherosclerosis. No apical pneumothorax. Cerumen within the bilateral external ear canals. Skull base intact. Multilevel spondylosis. Most advanced at C5-6 and C6-7. Facets are well-aligned. Advanced left greater than right facet  arthropathy, including at C3-4 and C4-5.  Coronal reformats demonstrate a normal C1-C2 articulation.  IMPRESSION: 1.  No acute intracranial abnormality. 2. No acute fracture or subluxation within the cervical spine. 3. Advanced spondylosis. Straightening of expected cervical lordosis could be positional, due to muscular spasm, or ligamentous injury.   Electronically Signed   By: Jeronimo Greaves M.D.   On: 03/03/2013 22:29   Ct Pelvis Wo Contrast  03/04/2013   CLINICAL DATA:  Fall it ulna. Demented. Rule out femoral neck fracture.  EXAM: CT PELVIS WITHOUT CONTRAST  TECHNIQUE: Multidetector CT imaging of the pelvis was performed following the standard protocol without intravenous contrast.  COMPARISON:  Plain films of earlier in the day.  FINDINGS: Soft tissues: Normal pelvic small bowel loops, without evidence of free intraperitoneal air or hemorrhage.  No pelvic adenopathy. Multiple bladder diverticula. Mild prostatomegaly. A left inguinal hernia contains fat and minimal descending/sigmoid colon.  No soft tissue hematoma.  Bones: Mild degenerative irregularity of the right sacroiliac joint. No acute fracture or dislocation. Joint spaces maintained. Degenerative disc disease at L4-5 and L3-4.  IMPRESSION: 1. No acute osseous finding. 2. Bladder diverticula, suggesting a component of bladder outlet obstruction. 3. Left inguinal hernia containing fat and minimal nonobstructive sigmoid colon.   Electronically Signed   By: Jeronimo Greaves M.D.   On: 03/04/2013 00:09   Dg Knee Right Port  03/03/2013  CLINICAL DATA:  Fall with right knee pain and abrasion.  EXAM: PORTABLE RIGHT KNEE - 1-2 VIEW  COMPARISON:  None.  FINDINGS: AP and cross-table lateral views. Minimal medial mild patellofemoral joint space narrowing and osteophyte formation. No acute fracture or dislocation. Difficult to exclude a small suprapatellar joint effusion.  IMPRESSION: Possible small suprapatellar joint effusion. No fracture identified. If  possible, dedicated four view series should be considered.   Electronically Signed   By: Jeronimo Greaves M.D.   On: 03/03/2013 23:50     Medications: Scheduled: . docusate sodium  100 mg Oral BID  . enoxaparin (LOVENOX) injection  40 mg Subcutaneous Daily  . levofloxacin  500 mg Oral QHS  . tamsulosin  0.4 mg Oral QPC supper   Continuous: . 0.9 % NaCl with KCl 20 mEq / L 75 mL/hr at 03/04/13 1415    Assessment/Plan: Active Problems:  1. Rhabdomyolysis- secondary to prolonged period on floor. Continue IV fluid hydration.  CK trending down.  Renal function stable.   2. Aspiration pneumonia- possible aspiration pneumonitis- continue Levaquin, O2 as needed. Speech therapy evaluation reviewed- no restrictions.   3. Recurrent falls/generalized weakness- apparently fell at least twice in 24 hour period. No focal neurologic deficits to indicate CVA or to warrant additional imaging. Suspect generalized weakness/FTT is underlying cause. Continue PT/OT- needs SNF rehab.  4. Urinary retention- continue Flomax. Voiding trial today. 5. Hyperbilirubinemia- primarily indirect- trending down. 6. Acute Delirium superimposed on underlying dementia- avoid sedating medications 7. Disposition- Supportive daughters have been checking on him daily and preparing meals but has been living independently prior to admission but he now needs rehab prior to return home due to weakness.  IR is not an option per Rehab Admission coordinator.  Should be stable for discharge to SNF tomorrow if bed is available.  Daughters/POA agreeable to SNF.  He is not capable of making medical decisions on his own.       LOS: 2 days   Razia Screws,W DOUGLAS 03/05/2013, 9:56 AM

## 2013-03-06 LAB — COMPREHENSIVE METABOLIC PANEL
ALT: 16 U/L (ref 0–53)
Albumin: 2.9 g/dL — ABNORMAL LOW (ref 3.5–5.2)
Alkaline Phosphatase: 45 U/L (ref 39–117)
BUN: 12 mg/dL (ref 6–23)
Chloride: 100 mEq/L (ref 96–112)
Creatinine, Ser: 0.66 mg/dL (ref 0.50–1.35)
GFR calc Af Amer: >90 mL/min (ref >90)
GFR calc non Af Amer: 80 mL/min — ABNORMAL LOW (ref >90)
Glucose, Bld: 98 mg/dL (ref 70–99)
Potassium: 3.5 mEq/L (ref 3.5–5.1)
Total Bilirubin: 1.2 mg/dL (ref 0.3–1.2)

## 2013-03-06 LAB — CK: Total CK: 328 U/L — ABNORMAL HIGH (ref 7–232)

## 2013-03-06 LAB — CBC
HCT: 40.1 % (ref 39.0–52.0)
Hemoglobin: 13.7 g/dL (ref 13.0–17.0)
MCHC: 34.2 g/dL (ref 30.0–36.0)
MCV: 94.8 fL (ref 78.0–100.0)
Platelets: 158 10*3/uL (ref 150–400)
RDW: 14 % (ref 11.5–15.5)
WBC: 9.3 10*3/uL (ref 4.0–10.5)

## 2013-03-06 NOTE — Progress Notes (Signed)
Clinical Child psychotherapist (CSW) received weekday handoff report stating that patient is medically stable for D/C and to give bed offers. CSW gave bed offers and encouraged daughter to choose her top three and CSW would call and try to arrange an admission this weekend. Daughter verbalized her understanding and reported that she would call CSW this weekend with top three choices.   Jetta Lout, LCSWA Weekend CSW [phone]

## 2013-03-06 NOTE — Progress Notes (Signed)
Subjective: Was confused last night. Doing better this AM Ate OK at breakfast. Denies any pain  Objective: Vital signs in last 24 hours: Temp:  [98.5 F (36.9 C)-98.8 F (37.1 C)] 98.8 F (37.1 C) (12/06 0417) Pulse Rate:  [91] 91 (12/06 0417) Resp:  [18] 18 (12/06 0417) BP: (122-132)/(64-75) 132/75 mmHg (12/06 0417) SpO2:  [91 %-96 %] 96 % (12/06 0417) Weight:  [94.2 kg (207 lb 10.8 oz)] 94.2 kg (207 lb 10.8 oz) (12/06 0417)  Intake/Output from previous day: 12/05 0701 - 12/06 0700 In: -  Out: 700 [Urine:700] Intake/Output this shift:    Sitting up in no distress. O2 in place. Lungs no wheeze. Ht regular. abd soft NT extrems no edema. Awake, very HOH, calm  Lab Results   Recent Labs  03/05/13 0359 03/06/13 0430  WBC 11.0* 9.3  RBC 4.42 4.23  HGB 14.5 13.7  HCT 41.9 40.1  MCV 94.8 94.8  MCH 32.8 32.4  RDW 14.1 14.0  PLT 185 158    Recent Labs  03/05/13 0359 03/06/13 0430  NA 137 136  K 3.9 3.5  CL 103 100  CO2 23 24  GLUCOSE 95 98  BUN 16 12  CREATININE 0.72 0.66  CALCIUM 8.5 8.4  Results for Anthony, [name] (MRN [id number]) as of 03/06/2013 10:35  Ref. Range 03/06/2013 04:30  Alkaline Phosphatase Latest Range: 39-117 U/L 45  Albumin Latest Range: 3.5-5.2 g/dL 2.9 (L)  AST Latest Range: 0-37 U/L 35  ALT Latest Range: 0-53 U/L 16  Total Protein Latest Range: 6.0-8.3 g/dL 5.9 (L)  Total Bilirubin Latest Range: 0.3-1.2 mg/dL 1.2   Results for Anthony, [name] (MRN [id number]) as of 03/06/2013 10:35  Ref. Range 03/04/2013 04:25 03/04/2013 08:55 03/05/2013 03:59 03/06/2013 04:30  CK Total Latest Range: 7-232 U/L 1276 (H)  702 (H) 328 (H)   Studies/Results: Dg Chest 2 View  03/05/2013   CLINICAL DATA:  Pneumonia  EXAM: CHEST  2 VIEW  COMPARISON:  Chest x-ray of March 03, 2013.  FINDINGS: The lung volumes remain low. The left hemidiaphragm is better demonstrated today. The pulmonary interstitial markings remain mildly increased bilaterally. The cardiopericardial  silhouette remains enlarged and the pulmonary vascularity is prominent. There remains blunting of the posterior costophrenic angles bilaterally. No definite acute bony abnormality is demonstrated.  IMPRESSION: There has been slight interval improvement in the appearance of the lungs especially at the left lung base. There is persistent interstitial edema or pneumonia and confluent density in the infrahilar regions bilaterally may reflect pneumonia or atelectasis.   Electronically Signed   By: David  [place]   On: 03/05/2013 14:50    Scheduled Meds: . docusate sodium  100 mg Oral BID  . enoxaparin (LOVENOX) injection  40 mg Subcutaneous Daily  . levofloxacin  500 mg Oral QHS  . tamsulosin  0.4 mg Oral QPC supper   Continuous Infusions: . 0.9 % NaCl with KCl 20 mEq / L 75 mL/hr (03/05/13 1603)   PRN Meds:acetaminophen, acetaminophen, HYDROcodone-acetaminophen, ondansetron (ZOFRAN) IV, ondansetron, zolpidem  Assessment/Plan:  1. Rhabdomyolysis-improving CPK 2. Aspiration pneumonia- possible aspiration pneumonitis- continue Levaquin, O2 as needed. Speech therapy evaluation reviewed- no restrictions. No fever. WBC OK 3. Recurrent falls/generalized weakness- apparently fell at least twice in 24 hour period. No focal neurologic deficits to indicate CVA or to warrant additional imaging. Suspect generalized weakness/FTT is underlying cause. Continue PT/OT- needs SNF rehab.  4. Urinary retention- dioing better.  5. Hyperbilirubinemia- improved.  6. Acute Delirium superimposed on underlying dementia- avoid  sedating medications  7. Disposition- Supportive daughters have been checking on him daily and preparing meals but has been living independently prior to admission but he now needs rehab prior to return home due to weakness. IR is not an option per Rehab Admission coordinator. Should be stable for discharge to SNF tomorrow if bed is available. Daughters/POA agreeable to SNF. He is not capable of making  medical decisions on his own.  8. PROTEIN CALORIE MALNUTRITION: eating better    LOS: 3 days   [name] 03/06/2013, 10:34 AM

## 2013-03-07 ENCOUNTER — Inpatient Hospital Stay (HOSPITAL_COMMUNITY): Payer: Medicare Other

## 2013-03-07 LAB — CBC
HCT: 38.7 % — ABNORMAL LOW (ref 39.0–52.0)
MCHC: 34.6 g/dL (ref 30.0–36.0)
Platelets: 205 10*3/uL (ref 150–400)
RDW: 13.9 % (ref 11.5–15.5)
WBC: 8.2 10*3/uL (ref 4.0–10.5)

## 2013-03-07 LAB — COMPREHENSIVE METABOLIC PANEL
ALT: 15 U/L (ref 0–53)
AST: 29 U/L (ref 0–37)
Albumin: 2.8 g/dL — ABNORMAL LOW (ref 3.5–5.2)
Alkaline Phosphatase: 43 U/L (ref 39–117)
BUN: 11 mg/dL (ref 6–23)
Chloride: 100 mEq/L (ref 96–112)
GFR calc Af Amer: >90 mL/min (ref >90)
Glucose, Bld: 99 mg/dL (ref 70–99)
Potassium: 3.6 mEq/L (ref 3.5–5.1)
Sodium: 136 mEq/L (ref 135–145)
Total Bilirubin: 1.1 mg/dL (ref 0.3–1.2)
Total Protein: 5.9 g/dL — ABNORMAL LOW (ref 6.0–8.3)

## 2013-03-07 LAB — CK: Total CK: 213 U/L (ref 7–232)

## 2013-03-07 NOTE — Progress Notes (Signed)
Subjective: Doing pretty well. Some confusion over condom cath. O2 still in place. On tele. Communication is difficult due to hearing. No SOB evident   Objective: Vital signs in last 24 hours: Temp:  [97.4 F (36.3 C)-98.1 F (36.7 C)] 98.1 F (36.7 C) (12/07 0353) Pulse Rate:  [77-94] 85 (12/07 0353) Resp:  [16-18] 18 (12/07 0353) BP: (123-151)/(64-83) 137/77 mmHg (12/07 0353) SpO2:  [93 %-94 %] 93 % (12/07 0353) Weight:  [94.62 kg (208 lb 9.6 oz)] 94.62 kg (208 lb 9.6 oz) (12/07 0353)  Intake/Output from previous day: 12/06 0701 - 12/07 0700 In: 580 [P.O.:580] Out: 675 [Urine:675] Intake/Output this shift:    Sitting in chair with O2 in place. Lungs fairly clear. Ht regular abd soft NT 1+edema HOH, a bit confused  Lab Results   Recent Labs  03/06/13 0430 03/07/13 0528  WBC 9.3 8.2  RBC 4.23 4.09*  HGB 13.7 13.4  HCT 40.1 38.7*  MCV 94.8 94.6  MCH 32.4 32.8  RDW 14.0 13.9  PLT 158 205    Recent Labs  03/06/13 0430 03/07/13 0528  NA 136 136  K 3.5 3.6  CL 100 100  CO2 24 26  GLUCOSE 98 99  BUN 12 11  CREATININE 0.66 0.63  CALCIUM 8.4 8.6    Studies/Results: Dg Chest 2 View  03/05/2013   CLINICAL DATA:  Pneumonia  EXAM: CHEST  2 VIEW  COMPARISON:  Chest x-ray of March 03, 2013.  FINDINGS: The lung volumes remain low. The left hemidiaphragm is better demonstrated today. The pulmonary interstitial markings remain mildly increased bilaterally. The cardiopericardial silhouette remains enlarged and the pulmonary vascularity is prominent. There remains blunting of the posterior costophrenic angles bilaterally. No definite acute bony abnormality is demonstrated.  IMPRESSION: There has been slight interval improvement in the appearance of the lungs especially at the left lung base. There is persistent interstitial edema or pneumonia and confluent density in the infrahilar regions bilaterally may reflect pneumonia or atelectasis.   Electronically Signed   By: David   [place]   On: 03/05/2013 14:50    Scheduled Meds: . docusate sodium  100 mg Oral BID  . enoxaparin (LOVENOX) injection  40 mg Subcutaneous Daily  . levofloxacin  500 mg Oral QHS  . tamsulosin  0.4 mg Oral QPC supper   Continuous Infusions: . 0.9 % NaCl with KCl 20 mEq / L 75 mL/hr at 03/06/13 2046   PRN Meds:acetaminophen, acetaminophen, HYDROcodone-acetaminophen, ondansetron (ZOFRAN) IV, ondansetron, zolpidem  Assessment/Plan:  1. Rhabdomyolysis-improving CPK , almost back to normal, Cr still OK 2. Aspiration pneumonia- possible aspiration pneumonitis- continue Levaquin, O2  . Speech therapy evaluation reviewed- no restrictions. No fever. WBC OK . Check CXR today 3. Recurrent falls/generalized weakness: still some impulsivity, at risk for falls. SNF needed 4. Urinary retention- condom cath in palce 5. Hyperbilirubinemia- improved.  6. Acute Delirium superimposed on underlying dementia- still some confusion  7. Disposition-  Clearly needs SNF, Hope he can be ready tomorrow. Reviewed with dtrs. They are struggling with the decision.  8. PROTEIN CALORIE MALNUTRITION: eating better    LOS: 4 days   Anthony [name] ALAN 03/07/2013, 11:29 AM

## 2013-03-08 DIAGNOSIS — R531 Weakness: Secondary | ICD-10-CM | POA: Diagnosis present

## 2013-03-08 DIAGNOSIS — F05 Delirium due to known physiological condition: Secondary | ICD-10-CM | POA: Diagnosis present

## 2013-03-08 DIAGNOSIS — E46 Unspecified protein-calorie malnutrition: Secondary | ICD-10-CM | POA: Diagnosis present

## 2013-03-08 LAB — CBC
HCT: 42 % (ref 39.0–52.0)
Hemoglobin: 14.5 g/dL (ref 13.0–17.0)
MCHC: 34.5 g/dL (ref 30.0–36.0)
MCV: 94 fL (ref 78.0–100.0)
WBC: 7.9 10*3/uL (ref 4.0–10.5)

## 2013-03-08 LAB — CK: Total CK: 138 U/L (ref 7–232)

## 2013-03-08 LAB — COMPREHENSIVE METABOLIC PANEL
Alkaline Phosphatase: 48 U/L (ref 39–117)
BUN: 12 mg/dL (ref 6–23)
CO2: 26 mEq/L (ref 19–32)
Creatinine, Ser: 0.65 mg/dL (ref 0.50–1.35)
GFR calc Af Amer: >90 mL/min (ref >90)
GFR calc non Af Amer: 80 mL/min — ABNORMAL LOW (ref >90)
Glucose, Bld: 97 mg/dL (ref 70–99)
Potassium: 4.1 mEq/L (ref 3.5–5.1)
Sodium: 136 mEq/L (ref 135–145)
Total Bilirubin: 1 mg/dL (ref 0.3–1.2)
Total Protein: 6.4 g/dL (ref 6.0–8.3)

## 2013-03-08 MED ORDER — LEVOFLOXACIN 500 MG PO TABS: 500.0000 mg | ORAL_TABLET | Freq: Every day | ORAL | Status: AC

## 2013-03-08 MED ORDER — TAMSULOSIN HCL 0.4 MG PO CAPS: 0.4000 mg | ORAL_CAPSULE | Freq: Every day | ORAL | Status: DC

## 2013-03-08 NOTE — Progress Notes (Signed)
CSW spoke with [facility] who stated that they would be able to make a bed offer tomorrow but not today.   CSW called back [facility]	[place] and admissions coordinator is still not available. CSW left another message.   CSW will continue to work on d/c summary.      [name] [facility]  4N 1[address];  [phone] Phone: [phone]

## 2013-03-08 NOTE — Progress Notes (Signed)
Pt to be d/c today to [facility]   Pt and family agreeable. Confirmed plans with facility.  Plan transfer via EMS.    Frederico Gerling LCSWA  [facility]  4N 1[address]16;  6N1-16 Phone: [phone]    

## 2013-03-08 NOTE — Progress Notes (Signed)
Physical Therapy Treatment Patient Details Name: Anthony [name] MRN: [id number] DOB: 01-Oct-1915 Today's Date: 03/08/2013 Time: 4098-1191 PT Time Calculation (min): 41 min  PT Assessment / Plan / Recommendation  History of Present Illness Patient admitted with aspiration pneumonia and Rhabdomyolysis secondary to prolonged period on the ground/floor after second fall in 24 hour period.  Patient with urinary retention and significant right knee pain and does not like to put weight through it.   PT Comments   Pt very eager to ambulate this pm prior to D/C.  Pt with urinary incontinence during initial part of ambulating requiring 2nd person to A with hygiene.  Pt able to greatly improve activity tolerance today.    Follow Up Recommendations  SNF     Does the patient have the potential to tolerate intense rehabilitation     Barriers to Discharge        Equipment Recommendations  Rolling walker with 5" wheels    Recommendations for Other Services    Frequency Min 3X/week   Progress towards PT Goals Progress towards PT goals: Progressing toward goals  Plan Current plan remains appropriate    Precautions / Restrictions Precautions Precautions: Fall Precaution Comments: Very HOH- right ear is better than left, right knee pain.   Restrictions Weight Bearing Restrictions: No   Pertinent Vitals/Pain R knee "Very stiff.".     Mobility  Bed Mobility Bed Mobility: Not assessed Transfers Transfers: Sit to Stand;Stand to Sit Sit to Stand: 3: Mod assist;With upper extremity assist;From chair/3-in-1 Stand to Sit: 3: Mod assist;With upper extremity assist;To chair/3-in-1 Details for Transfer Assistance: cues for UE use and controlling descent to chair.   Ambulation/Gait Ambulation/Gait Assistance: 4: Min assist Ambulation Distance (Feet): 150 Feet Assistive device: Rolling walker Ambulation/Gait Assistance Details: cues to stay closer to RW, upright posture.  pt able to greatly increase  ambulation distance.   Gait Pattern: Step-through pattern;Decreased stride length;Antalgic;Trunk flexed Stairs: No Wheelchair Mobility Wheelchair Mobility: No    Exercises     PT Diagnosis:    PT Problem List:   PT Treatment Interventions:     PT Goals (current goals can now be found in the care plan section) Acute Rehab PT Goals Time For Goal Achievement: 03/11/13 Potential to Achieve Goals: Good  Visit Information  Last PT Received On: 03/08/13 Assistance Needed: +2 (for chair follows) History of Present Illness: Patient admitted with aspiration pneumonia and Rhabdomyolysis secondary to prolonged period on the ground/floor after second fall in 24 hour period.  Patient with urinary retention and significant right knee pain and does not like to put weight through it.    Subjective Data      Cognition  Cognition Arousal/Alertness: Awake/alert Behavior During Therapy: WFL for tasks assessed/performed Overall Cognitive Status: History of cognitive impairments - at baseline General Comments: pt very HOH, but when he hears you he responds appropriately to questions.      Balance  Balance Balance Assessed: No  End of Session PT - End of Session Equipment Utilized During Treatment: Gait belt;Oxygen Activity Tolerance: Patient tolerated treatment well Patient left: in chair;with call bell/phone within reach Nurse Communication: Mobility status   GP     Sunny Schlein, [place] [phone] 03/08/2013, 2:32 PM

## 2013-03-08 NOTE — Progress Notes (Signed)
Nursing note  Report called to [facility] spoke to [facility], will transport to SNF via EMS Anastyn Ayars, Randall An RN

## 2013-03-08 NOTE — Discharge Summary (Signed)
DISCHARGE SUMMARY  Anthony [name]  MR#: [id number]  DOB:07/28/1996  Date of Admission: 03/03/2013 Date of Discharge: 03/09/1999  Attending Physician:Rokia Bosket,W DOUGLAS  Patient's PCP: Kari Baars  Consults:  None  Discharge Diagnoses: Active Problems:   Aspiration pneumonia   Rhabdomyolysis   Recurrent falls   Urinary retention   Unspecified protein-calorie malnutrition   Generalized weakness   Delirium superimposed on dementia  Past Medical History  Diagnosis Date  . Dementia   . Osteoporosis    History reviewed. No pertinent past surgical history.   Discharge Medications:   Medication List         levofloxacin 500 MG tablet  Commonly known as:  LEVAQUIN  Take 1 tablet (500 mg total) by mouth at bedtime.     tamsulosin 0.4 MG Caps capsule  Commonly known as:  FLOMAX  Take 1 capsule (0.4 mg total) by mouth daily after supper.        Hospital Procedures: Dg Chest 1 View  03/03/2013   CLINICAL DATA:  Dementia, fall.  EXAM: CHEST - 1 VIEW  COMPARISON:  05/03/2004  FINDINGS: Prominent cardiomediastinal contours. Hyperaeration. Mild lung base opacities. Small effusions not excluded. No pneumothorax. Multilevel degenerative change.  IMPRESSION: Prominent cardiac contour, similar to prior.  Hypoaeration with mild lung base opacities ; atelectasis versus infiltrate.   Electronically Signed   By: Jearld Lesch M.D.   On: 03/03/2013 23:00   Dg Chest 2 View  03/07/2013   CLINICAL DATA:  Pneumonia with chest pain and shortness of breath.  EXAM: CHEST  2 VIEW  COMPARISON:  03/05/2013  FINDINGS: Heart is enlarged. There patchy infiltrates at the lung bases bilaterally, right greater than left. There is pulmonary vascular congestion. Small bilateral pleural effusions are present.  IMPRESSION: 1. Bilateral lower lobe infiltrates, consistent with infection and/or edema. 2. Cardiomegaly and bilateral effusions.   Electronically Signed   By: Rosalie Gums M.D.   On: 03/07/2013 22:38    Dg Chest 2 View  03/05/2013   CLINICAL DATA:  Pneumonia  EXAM: CHEST  2 VIEW  COMPARISON:  Chest x-ray of March 03, 2013.  FINDINGS: The lung volumes remain low. The left hemidiaphragm is better demonstrated today. The pulmonary interstitial markings remain mildly increased bilaterally. The cardiopericardial silhouette remains enlarged and the pulmonary vascularity is prominent. There remains blunting of the posterior costophrenic angles bilaterally. No definite acute bony abnormality is demonstrated.  IMPRESSION: There has been slight interval improvement in the appearance of the lungs especially at the left lung base. There is persistent interstitial edema or pneumonia and confluent density in the infrahilar regions bilaterally may reflect pneumonia or atelectasis.   Electronically Signed   By: David  [place]   On: 03/05/2013 14:50   Dg Pelvis 1-2 Views  03/03/2013   CLINICAL DATA:  Fall and demented.  EXAM: PELVIS - 1-2 VIEW  COMPARISON:  None.  FINDINGS: Two views of the pelvis. Both are moderate to markedly degraded due to overlying pannus. The femoral heads are located. L5-S1 degenerative disc disease. Sacroiliac joints are grossly symmetric. No displaced fracture identified. Equivocal osseous irregularity about the left femoral neck.  IMPRESSION: Moderate to markedly degraded exam secondary to overlying pannus. No gross acute osseous abnormality. Favor degenerative irregularity about the left femoral neck. Correlate with left-sided symptoms. If symptoms warrant, consider CT.   Electronically Signed   By: Jeronimo Greaves M.D.   On: 03/03/2013 22:50   Ct Head Wo Contrast  03/03/2013   CLINICAL DATA:  Fall.  Demented.  EXAM: CT HEAD WITHOUT CONTRAST  CT CERVICAL SPINE WITHOUT CONTRAST  TECHNIQUE: Multidetector CT imaging of the head and cervical spine was performed following the standard protocol without intravenous contrast. Multiplanar CT image reconstructions of the cervical spine were also  generated.  COMPARISON:  None.  FINDINGS: CT HEAD FINDINGS  Sinuses/Soft tissues: Mucosal thickening of the right maxillary sinus. Bilateral antrostomies. Minimal motion degradation. Clear mastoid air cells.  Intracranial: Expected cerebral atrophy. Relatively mild low density in the periventricular white matter likely related to small vessel disease. No mass lesion, hemorrhage, hydrocephalus, acute infarct, intra-axial, or extra-axial fluid collection.  CT CERVICAL SPINE FINDINGS  Spinal visualization through the bottom of T1. Prevertebral soft tissues are within normal limits. Carotid atherosclerosis. No apical pneumothorax. Cerumen within the bilateral external ear canals. Skull base intact. Multilevel spondylosis. Most advanced at C5-6 and C6-7. Facets are well-aligned. Advanced left greater than right facet arthropathy, including at C3-4 and C4-5.  Coronal reformats demonstrate a normal C1-C2 articulation.  IMPRESSION: 1.  No acute intracranial abnormality. 2. No acute fracture or subluxation within the cervical spine. 3. Advanced spondylosis. Straightening of expected cervical lordosis could be positional, due to muscular spasm, or ligamentous injury.   Electronically Signed   By: Jeronimo Greaves M.D.   On: 03/03/2013 22:29   Ct Cervical Spine Wo Contrast  03/03/2013   CLINICAL DATA:  Fall.  Demented.  EXAM: CT HEAD WITHOUT CONTRAST  CT CERVICAL SPINE WITHOUT CONTRAST  TECHNIQUE: Multidetector CT imaging of the head and cervical spine was performed following the standard protocol without intravenous contrast. Multiplanar CT image reconstructions of the cervical spine were also generated.  COMPARISON:  None.  FINDINGS: CT HEAD FINDINGS  Sinuses/Soft tissues: Mucosal thickening of the right maxillary sinus. Bilateral antrostomies. Minimal motion degradation. Clear mastoid air cells.  Intracranial: Expected cerebral atrophy. Relatively mild low density in the periventricular white matter likely related to small  vessel disease. No mass lesion, hemorrhage, hydrocephalus, acute infarct, intra-axial, or extra-axial fluid collection.  CT CERVICAL SPINE FINDINGS  Spinal visualization through the bottom of T1. Prevertebral soft tissues are within normal limits. Carotid atherosclerosis. No apical pneumothorax. Cerumen within the bilateral external ear canals. Skull base intact. Multilevel spondylosis. Most advanced at C5-6 and C6-7. Facets are well-aligned. Advanced left greater than right facet arthropathy, including at C3-4 and C4-5.  Coronal reformats demonstrate a normal C1-C2 articulation.  IMPRESSION: 1.  No acute intracranial abnormality. 2. No acute fracture or subluxation within the cervical spine. 3. Advanced spondylosis. Straightening of expected cervical lordosis could be positional, due to muscular spasm, or ligamentous injury.   Electronically Signed   By: Jeronimo Greaves M.D.   On: 03/03/2013 22:29   Ct Pelvis Wo Contrast  03/04/2013   CLINICAL DATA:  Fall it ulna. Demented. Rule out femoral neck fracture.  EXAM: CT PELVIS WITHOUT CONTRAST  TECHNIQUE: Multidetector CT imaging of the pelvis was performed following the standard protocol without intravenous contrast.  COMPARISON:  Plain films of earlier in the day.  FINDINGS: Soft tissues: Normal pelvic small bowel loops, without evidence of free intraperitoneal air or hemorrhage.  No pelvic adenopathy. Multiple bladder diverticula. Mild prostatomegaly. A left inguinal hernia contains fat and minimal descending/sigmoid colon.  No soft tissue hematoma.  Bones: Mild degenerative irregularity of the right sacroiliac joint. No acute fracture or dislocation. Joint spaces maintained. Degenerative disc disease at L4-5 and L3-4.  IMPRESSION: 1. No acute osseous finding. 2. Bladder diverticula, suggesting a component of bladder outlet obstruction.  3. Left inguinal hernia containing fat and minimal nonobstructive sigmoid colon.   Electronically Signed   By: Jeronimo Greaves M.D.    On: 03/04/2013 00:09   Dg Knee Right Port  03/03/2013   CLINICAL DATA:  Fall with right knee pain and abrasion.  EXAM: PORTABLE RIGHT KNEE - 1-2 VIEW  COMPARISON:  None.  FINDINGS: AP and cross-table lateral views. Minimal medial mild patellofemoral joint space narrowing and osteophyte formation. No acute fracture or dislocation. Difficult to exclude a small suprapatellar joint effusion.  IMPRESSION: Possible small suprapatellar joint effusion. No fracture identified. If possible, dedicated four view series should be considered.   Electronically Signed   By: Jeronimo Greaves M.D.   On: 03/03/2013 23:50    History of Present Illness: Patient is a 79 year old male who normally lives independently. Was found down by his family today. THey see him on a daily basis and indicated that he was in his usual state of health yesterday. He was found in the bathroom today. THe patient cannot remember the fall. States pain in R knee but evaluated in ER and no fracture found.  Imaging in the ER showed no fracture or injury (as above).  CK was 1276.  Hospital Course: Mr. Cato was admitted to a medical bed. Laboratory evaluation was consistent with acute rhabdomyolysis with a CK of 1276 on admission. He was provided IV fluid hydration with improvement to normal prior to discharge. Review of history with his daughter indicated that he was on the floor for at least 8 hours after she arrived in an attempt to convince him to come to the hospital. He was evaluated by physical therapy and occupational therapy and found have significant generalized weakness. They recommended skilled nursing facility for acute rehabilitation.  Chest x-ray on admission suggested possible bilateral lower lobe infiltrate suggestive of aspiration pneumonitis. Speech therapy evaluated the patient and recommended regular diet with thin liquids and aspiration precautions. He was treated with Levaquin for possible aspiration pneumonia. His white blood count  was mildly elevated on admission and return to normal. He remained afebrile. Minimal cough noted.  Shortly after admission, the patient was found have urinary retention. A Foley catheter was placed with greater than 600 cc of urine output. He was started on Flomax and provided a voiding trial. He is now voiding spontaneously with significant urgency.  The patient has moderate dementia at baseline. He has had increased sundowning during this hospitalization but is easily redirectable. He is hard of hearing which contributes to his confusion.  Fall precautions are emphasized to prevent falls as he is at high risk.  At this point, the patient is stable for discharge to a skilled nursing facility for acute rehabilitation. He has an extremely supportive family with daughter showed been caring for him on a daily basis. They're hopeful that he may return home after rehabilitation he may need additional support.  Day of Discharge Exam BP 157/82  Pulse 90  Temp(Src) 98.3 F (36.8 C) (Oral)  Resp 20  Ht 5\' 11"  (1.803 m)  Wt 94.62 kg (208 lb 9.6 oz)  BMI 29.11 kg/m2  SpO2 93%  Physical Exam: General appearance: alert and Disoriented, hard of hearing Eyes: no scleral icterus Throat: oropharynx moist without erythema Resp: clear to auscultation bilaterally Cardio: regular rate and rhythm GI: soft, non-tender; bowel sounds normal; no masses,  no organomegaly Extremities: no clubbing, cyanosis or edema  Discharge Labs:  Recent Labs  03/07/13 0528 03/08/13 0548  NA 136  136  K 3.6 4.1  CL 100 101  CO2 26 26  GLUCOSE 99 97  BUN 11 12  CREATININE 0.63 0.65  CALCIUM 8.6 9.3    Recent Labs  03/07/13 0528 03/08/13 0548  AST 29 27  ALT 15 16  ALKPHOS 43 48  BILITOT 1.1 1.0  PROT 5.9* 6.4  ALBUMIN 2.8* 3.0*    Recent Labs  03/07/13 0528 03/08/13 0548  WBC 8.2 7.9  HGB 13.4 14.5  HCT 38.7* 42.0  MCV 94.6 94.0  PLT 205 242     Recent Labs  03/06/13 0430 03/07/13 0528  03/08/13 0548  CKTOTAL 328* 213 138   CK on admission 1276 TSH 3.391  Discharge instructions:     Discharge Orders   Future Orders Complete By Expires   Diet general  As directed    Discharge instructions  As directed    Comments:     Fall precautions.  Continue PT/OT.   Increase activity slowly  As directed       Disposition: to SNF Acute Rehab pending bed availability  Follow-up Appts: Follow-up with Dr. Clelia Croft at [facility] 1 week after discharge from Skilled Nursing Facility- care to be provided by MD at facility while at [facility].  Condition on Discharge: stable  Tests Needing Follow-up: None  Code Status: DNR  Signed: Nusaiba Guallpa,W DOUGLAS 03/08/2013, 7:28 AM

## 2013-03-08 NOTE — Progress Notes (Signed)
CSW received a call from CM stating Pt would d/c today.   CSW left a message with [facility] to see which facility was chosen.   CSW awaiting a callback and will proceed with getting Pt ready for d/c.    [name] [facility]  4N 1[address]16;  [phone] Phone: [phone]

## 2013-03-08 NOTE — Progress Notes (Signed)
CSW received a message from Anthony [name] [phone] [facility]CP[facility] concerning placement. Ms. Anthony [name] is looking at a facility in [place] and will contact CSW back concerning choice.   CSW left a message with  Anthony [name] [phone] [facility] concerning bed offers in that area and requested she contact CSW back.   CSW will continue to follow Pt for d/c planning.    Anthony [name] [facility]  [address];  [phone] Phone: [phone]

## 2013-03-08 NOTE — Progress Notes (Signed)
CSW called [facility].   No further needs.    [name] [facility]  [address];  [phone] Phone: [phone]

## 2013-03-10 LAB — CULTURE, BLOOD (ROUTINE X 2): Culture: NO GROWTH

## 2013-10-18 ENCOUNTER — Emergency Department: Payer: Self-pay | Admitting: Emergency Medicine

## 2013-11-12 ENCOUNTER — Emergency Department: Payer: Self-pay | Admitting: Emergency Medicine

## 2013-11-12 LAB — URINALYSIS, COMPLETE
Bacteria: NONE SEEN
Bilirubin,UR: NEGATIVE
Glucose,UR: NEGATIVE mg/dL (ref 0–75)
Leukocyte Esterase: NEGATIVE
Nitrite: NEGATIVE
Ph: 5 (ref 4.5–8.0)
Protein: NEGATIVE
RBC,UR: 98 /HPF (ref 0–5)
SPECIFIC GRAVITY: 1.021 (ref 1.003–1.030)

## 2013-11-12 LAB — CBC
HCT: 45.5 % (ref 40.0–52.0)
HGB: 14.7 g/dL (ref 13.0–18.0)
MCH: 32.2 pg (ref 26.0–34.0)
MCHC: 32.4 g/dL (ref 32.0–36.0)
MCV: 100 fL (ref 80–100)
PLATELETS: 206 10*3/uL (ref 150–440)
RBC: 4.58 10*6/uL (ref 4.40–5.90)
RDW: 13.7 % (ref 11.5–14.5)
WBC: 8.8 10*3/uL (ref 3.8–10.6)

## 2013-11-12 LAB — COMPREHENSIVE METABOLIC PANEL
ALK PHOS: 56 U/L
ANION GAP: 7 (ref 7–16)
Albumin: 3.6 g/dL (ref 3.4–5.0)
BUN: 20 mg/dL — ABNORMAL HIGH (ref 7–18)
Bilirubin,Total: 0.9 mg/dL (ref 0.2–1.0)
Calcium, Total: 9.7 mg/dL (ref 8.5–10.1)
Chloride: 105 mmol/L (ref 98–107)
Co2: 29 mmol/L (ref 21–32)
Creatinine: 0.96 mg/dL (ref 0.60–1.30)
EGFR (African American): >60
GLUCOSE: 98 mg/dL (ref 65–99)
Osmolality: 284 (ref 275–301)
POTASSIUM: 3.9 mmol/L (ref 3.5–5.1)
SGOT(AST): 19 U/L (ref 15–37)
SGPT (ALT): 19 U/L
SODIUM: 141 mmol/L (ref 136–145)
TOTAL PROTEIN: 7.1 g/dL (ref 6.4–8.2)

## 2013-11-12 LAB — TROPONIN I

## 2013-12-30 ENCOUNTER — Ambulatory Visit: Payer: Self-pay | Admitting: Internal Medicine

## 2014-01-16 ENCOUNTER — Emergency Department: Payer: Self-pay | Admitting: Emergency Medicine

## 2014-01-16 LAB — URINALYSIS, COMPLETE
BACTERIA: NONE SEEN
Bilirubin,UR: NEGATIVE
Glucose,UR: NEGATIVE mg/dL (ref 0–75)
Ketone: NEGATIVE
LEUKOCYTE ESTERASE: NEGATIVE
NITRITE: NEGATIVE
PH: 6 (ref 4.5–8.0)
Protein: 100
SPECIFIC GRAVITY: 1.015 (ref 1.003–1.030)
Squamous Epithelial: NONE SEEN
WBC UR: 150 /HPF (ref 0–5)

## 2014-01-16 LAB — BASIC METABOLIC PANEL
ANION GAP: 9 (ref 7–16)
BUN: 14 mg/dL (ref 7–18)
CHLORIDE: 105 mmol/L (ref 98–107)
CREATININE: 0.76 mg/dL (ref 0.60–1.30)
Calcium, Total: 8.4 mg/dL — ABNORMAL LOW (ref 8.5–10.1)
Co2: 29 mmol/L (ref 21–32)
Glucose: 90 mg/dL (ref 65–99)
Osmolality: 285 (ref 275–301)
POTASSIUM: 4.2 mmol/L (ref 3.5–5.1)
Sodium: 143 mmol/L (ref 136–145)

## 2014-01-16 LAB — CBC
HCT: 42.8 % (ref 40.0–52.0)
HGB: 13.9 g/dL (ref 13.0–18.0)
MCH: 32 pg (ref 26.0–34.0)
MCHC: 32.4 g/dL (ref 32.0–36.0)
MCV: 99 fL (ref 80–100)
PLATELETS: 152 10*3/uL (ref 150–440)
RBC: 4.33 10*6/uL — ABNORMAL LOW (ref 4.40–5.90)
RDW: 13.5 % (ref 11.5–14.5)
WBC: 8.2 10*3/uL (ref 3.8–10.6)

## 2014-01-18 LAB — URINE CULTURE

## 2014-01-24 ENCOUNTER — Emergency Department: Payer: Self-pay | Admitting: Emergency Medicine

## 2014-01-24 LAB — BASIC METABOLIC PANEL
Anion Gap: 9 (ref 7–16)
BUN: 30 mg/dL — ABNORMAL HIGH (ref 7–18)
CALCIUM: 9.1 mg/dL (ref 8.5–10.1)
CO2: 26 mmol/L (ref 21–32)
Chloride: 105 mmol/L (ref 98–107)
Creatinine: 1.01 mg/dL (ref 0.60–1.30)
EGFR (Non-African Amer.): >60
GLUCOSE: 86 mg/dL (ref 65–99)
Osmolality: 285 (ref 275–301)
Potassium: 3.7 mmol/L (ref 3.5–5.1)
Sodium: 140 mmol/L (ref 136–145)

## 2014-01-24 LAB — CBC
HCT: 42.4 % (ref 40.0–52.0)
HGB: 13.6 g/dL (ref 13.0–18.0)
MCH: 32 pg (ref 26.0–34.0)
MCHC: 32.2 g/dL (ref 32.0–36.0)
MCV: 100 fL (ref 80–100)
Platelet: 183 10*3/uL (ref 150–440)
RBC: 4.26 10*6/uL — AB (ref 4.40–5.90)
RDW: 13.6 % (ref 11.5–14.5)
WBC: 9.7 10*3/uL (ref 3.8–10.6)

## 2014-01-25 ENCOUNTER — Emergency Department (HOSPITAL_COMMUNITY): Payer: Medicare Other

## 2014-01-25 ENCOUNTER — Emergency Department: Payer: Self-pay | Admitting: Emergency Medicine

## 2014-01-25 ENCOUNTER — Emergency Department (HOSPITAL_COMMUNITY)
Admission: EM | Admit: 2014-01-25 | Discharge: 2014-01-25 | Disposition: A | Discharge status: Home / Self Care | Payer: Medicare Other | Attending: Emergency Medicine | Admitting: Emergency Medicine

## 2014-01-25 ENCOUNTER — Encounter (HOSPITAL_COMMUNITY): Payer: Self-pay | Admitting: Emergency Medicine

## 2014-01-25 DIAGNOSIS — W1839XA Other fall on same level, initial encounter: Secondary | ICD-10-CM | POA: Insufficient documentation

## 2014-01-25 DIAGNOSIS — Z8739 Personal history of other diseases of the musculoskeletal system and connective tissue: Secondary | ICD-10-CM | POA: Insufficient documentation

## 2014-01-25 DIAGNOSIS — Z792 Long term (current) use of antibiotics: Secondary | ICD-10-CM | POA: Insufficient documentation

## 2014-01-25 DIAGNOSIS — F0390 Unspecified dementia without behavioral disturbance: Secondary | ICD-10-CM | POA: Diagnosis not present

## 2014-01-25 DIAGNOSIS — Y929 Unspecified place or not applicable: Secondary | ICD-10-CM | POA: Insufficient documentation

## 2014-01-25 DIAGNOSIS — Z79899 Other long term (current) drug therapy: Secondary | ICD-10-CM | POA: Insufficient documentation

## 2014-01-25 DIAGNOSIS — Y939 Activity, unspecified: Secondary | ICD-10-CM | POA: Diagnosis not present

## 2014-01-25 DIAGNOSIS — W19XXXA Unspecified fall, initial encounter: Secondary | ICD-10-CM

## 2014-01-25 DIAGNOSIS — C679 Malignant neoplasm of bladder, unspecified: Secondary | ICD-10-CM | POA: Insufficient documentation

## 2014-01-25 DIAGNOSIS — C689 Malignant neoplasm of urinary organ, unspecified: Secondary | ICD-10-CM

## 2014-01-25 DIAGNOSIS — R410 Disorientation, unspecified: Secondary | ICD-10-CM

## 2014-01-25 DIAGNOSIS — S80811A Abrasion, right lower leg, initial encounter: Secondary | ICD-10-CM | POA: Insufficient documentation

## 2014-01-25 DIAGNOSIS — R319 Hematuria, unspecified: Secondary | ICD-10-CM | POA: Diagnosis present

## 2014-01-25 LAB — URINE MICROSCOPIC-ADD ON

## 2014-01-25 LAB — COMPREHENSIVE METABOLIC PANEL
ALBUMIN: 3.3 g/dL — AB (ref 3.5–5.2)
ALK PHOS: 52 U/L (ref 39–117)
ALT: 12 U/L (ref 0–53)
AST: 33 U/L (ref 0–37)
Anion gap: 18 — ABNORMAL HIGH (ref 5–15)
BUN: 31 mg/dL — ABNORMAL HIGH (ref 6–23)
CALCIUM: 9.6 mg/dL (ref 8.4–10.5)
CO2: 22 mEq/L (ref 19–32)
Chloride: 101 mEq/L (ref 96–112)
Creatinine, Ser: 0.79 mg/dL (ref 0.50–1.35)
GFR calc Af Amer: 86 mL/min — ABNORMAL LOW (ref >90)
GFR calc non Af Amer: 74 mL/min — ABNORMAL LOW (ref >90)
Glucose, Bld: 100 mg/dL — ABNORMAL HIGH (ref 70–99)
POTASSIUM: 4 meq/L (ref 3.7–5.3)
Sodium: 141 mEq/L (ref 137–147)
TOTAL PROTEIN: 6.9 g/dL (ref 6.0–8.3)
Total Bilirubin: 0.7 mg/dL (ref 0.3–1.2)

## 2014-01-25 LAB — URINALYSIS, ROUTINE W REFLEX MICROSCOPIC
GLUCOSE, UA: NEGATIVE mg/dL
Ketones, ur: 15 mg/dL — AB
Nitrite: POSITIVE — AB
PH: 5 (ref 5.0–8.0)
Protein, ur: >300 mg/dL — AB
SPECIFIC GRAVITY, URINE: 1.02 (ref 1.005–1.030)
Urobilinogen, UA: 1 mg/dL (ref 0.0–1.0)

## 2014-01-25 LAB — CBC WITH DIFFERENTIAL/PLATELET
BASOS PCT: 0 % (ref 0–1)
Basophils Absolute: 0 10*3/uL (ref 0.0–0.1)
EOS ABS: 0.1 10*3/uL (ref 0.0–0.7)
Eosinophils Relative: 1 % (ref 0–5)
HCT: 40.9 % (ref 39.0–52.0)
HEMOGLOBIN: 14 g/dL (ref 13.0–17.0)
LYMPHS PCT: 15 % (ref 12–46)
Lymphs Abs: 1.2 10*3/uL (ref 0.7–4.0)
MCH: 32.7 pg (ref 26.0–34.0)
MCHC: 34.2 g/dL (ref 30.0–36.0)
MCV: 95.6 fL (ref 78.0–100.0)
MONO ABS: 1 10*3/uL (ref 0.1–1.0)
Monocytes Relative: 13 % — ABNORMAL HIGH (ref 3–12)
NEUTROS PCT: 71 % (ref 43–77)
Neutro Abs: 5.6 10*3/uL (ref 1.7–7.7)
PLATELETS: UNDETERMINED 10*3/uL (ref 150–400)
RBC: 4.28 MIL/uL (ref 4.22–5.81)
RDW: 13.8 % (ref 11.5–15.5)
WBC: 7.9 10*3/uL (ref 4.0–10.5)

## 2014-01-25 LAB — PROTIME-INR
INR: 1.19 (ref 0.00–1.49)
Prothrombin Time: 15.2 seconds (ref 11.6–15.2)

## 2014-01-25 LAB — TROPONIN I: Troponin I: <0.3 ng/mL (ref <0.30)

## 2014-01-25 MED ORDER — PHENAZOPYRIDINE HCL 100 MG PO TABS
100.0000 mg | ORAL_TABLET | Freq: Once | ORAL | Status: AC
  Administered 2014-01-25: 100 mg via ORAL
  Filled 2014-01-25: qty 1

## 2014-01-25 MED ORDER — RISPERIDONE 0.5 MG PO TABS
0.5000 mg | ORAL_TABLET | Freq: Once | ORAL | Status: AC
  Administered 2014-01-25: 0.5 mg via ORAL
  Filled 2014-01-25: qty 1

## 2014-01-25 MED ORDER — PHENAZOPYRIDINE HCL 100 MG PO TABS: 100.0000 mg | ORAL_TABLET | Freq: Two times a day (BID) | ORAL | Status: AC | PRN

## 2014-01-25 NOTE — ED Notes (Signed)
Pt to xray

## 2014-01-25 NOTE — ED Notes (Signed)
Pt from [facility] has had hematuria for approx 2 weeks went to [facility] tomes per medics for same complaint family requests pt to  Be seen here due to continued hematuria

## 2014-01-25 NOTE — Progress Notes (Signed)
Spoke with pt's daughter re: d/c planning for pt.  Pt lives in the memory care unit at [facility].  Daughter is concerned about pt needing a higher LOC, but also reluctant to move him because of his dementia.  Options for pt returning home with additional (private pay) caregivers, hospice/palliative care c/s,  HHC, tx to NH bed, etc.  CSW spoke to [place] at [facility] who is supportive of pt returning there at d/c and willing to speak with his PCP re: hospice c/s. [place] arrangements to be deferrred to hospice. List of private duty sitters given to daughter.  Daughter agreeable to d/c plan. Emotional support offered.

## 2014-01-25 NOTE — ED Notes (Signed)
Family arrived in room and daughter states pt recently diagnosed with mass in bladder and has not seen a urologist yet and has had bleeding from penis

## 2014-01-25 NOTE — Discharge Instructions (Signed)
Follow up with his primary care physician. If patient or family decides to pursue further investigation or treatment of the bladder tumor, follow-up with urology. Encourage fluids.   Bladder Cancer Bladder cancer is an abnormal growth of tissue in your bladder. Your bladder is the balloon-like sac in your pelvis. It collects and stores urine that comes from the kidneys through the ureters. The bladder wall is made of layers. If cancer spreads into these layers and through the wall of the bladder, it becomes more difficult to treat.  There are four stages of bladder cancer:  Stage I. Cancer at this stage occurs in the bladder's inner lining but has not invaded the muscular bladder wall.  Stage II. At this stage, cancer has invaded the bladder wall but is still confined to the bladder.  Stage III. By this stage, the cancer cells have spread through the bladder wall to surrounding tissue. They may also have spread to the prostate in men or the uterus or vagina in women.  Stage IV. By this stage, cancer cells may have spread to the lymph nodes and other organs, such as your lungs, bones, or liver. RISK FACTORS Although the cause of bladder cancer is not known, the following risk factors can increase your chances of getting bladder cancer:   Smoking.   Occupational exposures, such as rubber, leather, textile, dyes, chemicals, and paint.  Being white.  Age.   Being male.   Having chronic bladder inflammation.   Having a bladder cancer history.   Having a family history of bladder cancer (heredity).   Having had chemotherapy or radiation therapy to the pelvis.   Being exposed to arsenic.  SYMPTOMS   Blood in the urine.   Pain with urination.   Frequent bladder or urine infections.  Increase in urgency and frequency of urination. DIAGNOSIS  Your health care provider may suspect bladder cancer based on your description of urinary symptoms or based on the finding of  blood or infection in the urine (especially if this has recurred several times). Other tests or procedures that may be performed include:   A narrow tube being inserted into your bladder through your urethra (cystoscopy) in order to view the lining of your bladder for tumors.   A biopsy to sample the tumor to see if cancer is present.  If cancer is present, it will then be staged to determine its severity and extent. It is important to know how deeply into the bladder wall the cancer has grown and whether the cancer has spread to any other parts of your body. Staging may require blood tests or special scans such as a CT scan, MRI, bone scan, or chest X-ray.  TREATMENT  Once your cancer has been diagnosed and staged, you should discuss a treatment plan with your health care provider. Based on the stage of the cancer, one treatment or a combination of treatments may be recommended. The most common forms of treatment are:   Surgery. Procedures that may be done include transurethral resection and cystectomy.  Radiation therapy. This is infrequently used to treat bladder cancer.   Chemotherapy. During this treatment, drugs are used to kill cancer cells.  Immunotherapy. This is usually administered directly into the bladder. HOME CARE INSTRUCTIONS  Take medicines only as directed by your health care provider.   Maintain a healthy diet.   Consider joining a support group. This may help you learn to cope with the stress of having bladder cancer.   Seek advice  to help you manage treatment side effects.   Keep all follow-up visits as directed by your health care provider.   Inform your cancer specialist if you are admitted to the hospital.  [place] IF:  There is blood in your urine.  You have symptoms of a urinary tract infection. These include:  Tiredness.  Shakiness.  Weakness.  Muscle aches.  Abdominal pain.  Frequent and intense urge to urinate (in young  women).  Burning feeling in the bladder or urethra during urination (in young women). SEEK IMMEDIATE MEDICAL CARE IF:  You are unable to urinate. Document Released: 03/21/2003 Document Revised: 08/02/2013 Document Reviewed: 09/08/2012 [facility] Patient Information 2015 [place], [place]. This information is not intended to replace advice given to you by your health care provider. Make sure you discuss any questions you have with your health care provider.

## 2014-01-25 NOTE — ED Notes (Signed)
Attempted to call report to facility x 2. No answer. Will continue to call.

## 2014-01-25 NOTE — ED Provider Notes (Addendum)
CSN: [id number]     Arrival date & time 01/25/14  1232 History   First MD Initiated Contact with Patient 01/25/14 1308     Chief Complaint  Patient presents with  . Hematuria      HPI  Patient presents via EMS ultimately accompanied by his daughters from a memory care facility.  Their concerns are:  #1:   he has had gross hematuria for the last 2 weeks, was seen at [facility] and had an ultrasound, and they were given discharge instructions that he "has a bladder tumor".  They state that they weren't sure what he was supposed to be having done about this. He has a doctor that does "housecall visits" to his memory care facility. To her knowledge "nothing has been done". He continues to have gross hematuria.  #2:  He had 2 falls yesterday and his speech is less clear today. Normally he is demented. However, they state he is conversant although confused. He normally is able to walk with a walker and does so without difficulty. He fell yesterday morning and was seen at [facility]. Apparently no imaging was performed per the daughter's report. [place] again last night at the care facility. He was more agitated and had to be will be given extra medicine". Daughter saw him last night as well as this morning. One daughter feels that his speech is not is intelligible as it was yesterday and they present him  for evaluation here.  Past Medical History  Diagnosis Date  . Dementia   . Osteoporosis    History reviewed. No pertinent past surgical history. History reviewed. No pertinent family history. History  Substance Use Topics  . Smoking status: Never Smoker   . Smokeless tobacco: Never Used  . Alcohol Use: No    Review of Systems  Unable to perform ROS: Dementia      Allergies  Review of patient's allergies indicates no known allergies.  Home Medications   Prior to Admission medications   Medication Sig Start Date End Date Taking? Authorizing Provider  alum & mag  hydroxide-simeth (MAALOX/MYLANTA) 200-200-20 MG/5ML suspension Take 30 mLs by mouth 4 (four) times daily as needed for indigestion or heartburn.   Yes Historical Provider, MD  Cranberry 405 MG CAPS Take 810 mg by mouth daily.   Yes Historical Provider, MD  divalproex (DEPAKOTE SPRINKLE) 125 MG capsule Take 375 mg by mouth 2 (two) times daily.   Yes Historical Provider, MD  finasteride (PROSCAR) 5 MG tablet Take 5 mg by mouth daily.   Yes Historical Provider, MD  guaifenesin (ROBITUSSIN) 100 MG/5ML syrup Take 200 mg by mouth every 6 (six) hours as needed for cough.   Yes Historical Provider, MD  loperamide (IMODIUM) 2 MG capsule Take 2 mg by mouth as needed for diarrhea or loose stools.   Yes Historical Provider, MD  LORazepam (ATIVAN) 1 MG tablet Take 1 mg by mouth 2 (two) times daily as needed for anxiety. Take 1 mg by mouth in the morning, may take 1 mg by mouth every 24 hours as needed for anxiety.   Yes Historical Provider, MD  magnesium hydroxide (MILK OF MAGNESIA) 400 MG/5ML suspension Take 30 mLs by mouth at bedtime as needed for mild constipation.   Yes Historical Provider, MD  Menthol-Zinc Oxide (RISAMINE) 0.44-20.625 % OINT Apply 1 application topically every 4 (four) hours as needed (for redness).   Yes Historical Provider, MD  Neomycin-Bacitracin-Polymyxin (TRIPLE ANTIBIOTIC) [phone] OINT Apply 1 application topically as needed (  for wound care).   Yes Historical Provider, MD  traMADol (ULTRAM) 50 MG tablet Take 50 mg by mouth every 8 (eight) hours as needed for moderate pain.   Yes Historical Provider, MD  vitamin D, CHOLECALCIFEROL, 400 UNITS tablet Take 400 Units by mouth daily.   Yes Historical Provider, MD   BP 129/79  Pulse 78  Temp(Src) 96.4 F (35.8 C) (Rectal)  Resp 19  Ht 6' (1.829 m)  Wt 200 lb (90.719 kg)  BMI 27.12 kg/m2  SpO2 94% Physical Exam  Constitutional: He appears well-developed and well-nourished.  Non-toxic appearance. No distress.  Awake. Difficult to  understand his speech. He is conversant. Daughters seem able to understand him. Calm not agitated.  HENT:  Head: Normocephalic.  Mucous membranes the mouth only slightly dried. Conjunctiva not pale. No Signs of trauma to the head  Eyes: Conjunctivae are normal. Pupils are equal, round, and reactive to light. No scleral icterus.  Neck: Normal range of motion. Neck supple. No thyromegaly present.  Cardiovascular: Normal rate, regular rhythm and normal heart sounds.  Exam reveals no gallop and no friction rub.   No murmur heard. Sinus on the monitor.  Pulmonary/Chest: Effort normal and breath sounds normal. No respiratory distress. He has no wheezes. He has no rales.  Clear breath sounds bilaterally.  Abdominal: Soft. Bowel sounds are normal. He exhibits no distension. There is no tenderness. There is no rebound.  Soft benign  Musculoskeletal: Normal range of motion.  Abrasion and tenderness on the right lower leg anteriorly.  Neurological: He is alert.  Dementia. Able to answer simple yes and no questions.  Skin: Skin is warm and dry. No rash noted.    ED Course  Procedures (including critical care time) Labs Review Labs Reviewed  CBC WITH DIFFERENTIAL - Abnormal; Notable for the following:    Monocytes Relative 13 (*)    All other components within normal limits  COMPREHENSIVE METABOLIC PANEL - Abnormal; Notable for the following:    Glucose, Bld 100 (*)    BUN 31 (*)    Albumin 3.3 (*)    GFR calc non Af Amer 74 (*)    GFR calc Af Amer 86 (*)    Anion gap 18 (*)    All other components within normal limits  URINALYSIS, ROUTINE W REFLEX MICROSCOPIC - Abnormal; Notable for the following:    Color, Urine RED (*)    APPearance TURBID (*)    Hgb urine dipstick LARGE (*)    Bilirubin Urine MODERATE (*)    Ketones, ur 15 (*)    Protein, ur >300 (*)    Nitrite POSITIVE (*)    Leukocytes, UA SMALL (*)    All other components within normal limits  URINE MICROSCOPIC-ADD ON -  Abnormal; Notable for the following:    Bacteria, UA FEW (*)    All other components within normal limits  URINE CULTURE  TROPONIN I  PROTIME-INR    Imaging Review Dg Chest 1 View  01/25/2014   CLINICAL DATA:  Altered mental status.  Confusion.  EXAM: CHEST - 1 VIEW  COMPARISON:  Chest x-ray dated 11/12/2013  FINDINGS: There is chronic slight cardiomegaly. Pulmonary vascularity is normal. Minimal scarring at the lung bases. No acute infiltrates or effusions. No acute osseous abnormality.  IMPRESSION: No acute abnormalities.   Electronically Signed   By: Rozetta Nunnery M.D.   On: 01/25/2014 16:05   Dg Tibia/fibula Right  01/25/2014   CLINICAL DATA:  Laceration of the  lower leg secondary to a fall.  EXAM: RIGHT TIBIA AND FIBULA - 2 VIEW  COMPARISON:  03/03/2013  FINDINGS: There is no fracture or dislocation. There is diffuse soft tissue edema in the lower leg. No appreciable ankle effusion. Small knee effusion.  IMPRESSION: No acute osseous abnormality.   Electronically Signed   By: Rozetta Nunnery M.D.   On: 01/25/2014 16:08   Ct Head Wo Contrast  01/25/2014   CLINICAL DATA:  80 year old male with fall. Generalized weakness. Dementia. Initial encounter.  EXAM: CT HEAD WITHOUT CONTRAST  TECHNIQUE: Contiguous axial images were obtained from the base of the skull through the vertex without intravenous contrast.  COMPARISON:  11/13/2013 and 03/03/2013.  FINDINGS: No skull fracture or intracranial hemorrhage.  Global atrophy. Ventricular prominence may be related to central atrophy although difficult to exclude superimposed mild hydrocephalus. Appearance without change.  Small vessel disease type changes without CT evidence of large acute infarct.  No intracranial mass lesion noted on this unenhanced exam.  Vascular calcifications.  IMPRESSION: No skull fracture or intracranial hemorrhage.  Global atrophy. Ventricular prominence may be related to central atrophy although difficult to exclude superimposed  mild hydrocephalus. Appearance without change.  Small vessel disease type changes without CT evidence of large acute infarct.   Electronically Signed   By: Chauncey Cruel M.D.   On: 01/25/2014 15:24     EKG Interpretation None      MDM   Final diagnoses:  Confusion  Fall  Transitional cell carcinoma    Had a long discussion with the patient and his family. At her social worker visit with them. We discussed transitional cell bladder tumors. 89 and don't feel he would benefit from excision of this. Offered urological referral. Family after several discussions have elected to return to his facility tonight. We discuss simple follow-up with his primary care physician who can arrange urological follow-up should the family decided to pursue any further investigation. No signs of an acute process today.    Tanna Furry, MD 01/25/14 1819  Tanna Furry, MD 01/25/14 [phone]

## 2014-01-25 NOTE — ED Notes (Signed)
Pt returned from CT and now to x-ray.

## 2014-01-26 LAB — URINALYSIS, COMPLETE
Bacteria: NONE SEEN
Bilirubin,UR: NEGATIVE
Glucose,UR: NEGATIVE mg/dL (ref 0–75)
KETONE: NEGATIVE
Leukocyte Esterase: NEGATIVE
NITRITE: POSITIVE
Ph: 5 (ref 4.5–8.0)
RBC,UR: 946 /HPF (ref 0–5)
Specific Gravity: 1.025 (ref 1.003–1.030)
Squamous Epithelial: 3
WBC UR: 10 /HPF (ref 0–5)

## 2014-01-26 LAB — LIPASE, BLOOD: Lipase: 85 U/L (ref 73–393)

## 2014-01-26 LAB — COMPREHENSIVE METABOLIC PANEL
ALBUMIN: 3.2 g/dL — AB (ref 3.4–5.0)
ALK PHOS: 57 U/L
Anion Gap: 10 (ref 7–16)
BILIRUBIN TOTAL: 0.7 mg/dL (ref 0.2–1.0)
BUN: 23 mg/dL — ABNORMAL HIGH (ref 7–18)
CREATININE: 0.77 mg/dL (ref 0.60–1.30)
Calcium, Total: 8.7 mg/dL (ref 8.5–10.1)
Chloride: 108 mmol/L — ABNORMAL HIGH (ref 98–107)
Co2: 26 mmol/L (ref 21–32)
Glucose: 105 mg/dL — ABNORMAL HIGH (ref 65–99)
Potassium: 3.6 mmol/L (ref 3.5–5.1)
SGOT(AST): 42 U/L — ABNORMAL HIGH (ref 15–37)
SGPT (ALT): 21 U/L
SODIUM: 144 mmol/L (ref 136–145)
TOTAL PROTEIN: 7.1 g/dL (ref 6.4–8.2)

## 2014-01-26 LAB — URINE CULTURE
COLONY COUNT: NO GROWTH
Culture: NO GROWTH

## 2014-01-26 LAB — CBC WITH DIFFERENTIAL/PLATELET
Basophil #: 0 10*3/uL (ref 0.0–0.1)
Basophil %: 0.4 %
Eosinophil #: 0.2 10*3/uL (ref 0.0–0.7)
Eosinophil %: 1.9 %
HCT: 43.6 % (ref 40.0–52.0)
HGB: 14.6 g/dL (ref 13.0–18.0)
LYMPHS PCT: 17.5 %
Lymphocyte #: 1.6 10*3/uL (ref 1.0–3.6)
MCH: 33.3 pg (ref 26.0–34.0)
MCHC: 33.6 g/dL (ref 32.0–36.0)
MCV: 99 fL (ref 80–100)
Monocyte #: 1.3 x10 3/mm — ABNORMAL HIGH (ref 0.2–1.0)
Monocyte %: 14 %
NEUTROS ABS: 6.2 10*3/uL (ref 1.4–6.5)
Neutrophil %: 66.2 %
Platelet: 193 10*3/uL (ref 150–440)
RBC: 4.4 10*6/uL (ref 4.40–5.90)
RDW: 13.8 % (ref 11.5–14.5)
WBC: 9.4 10*3/uL (ref 3.8–10.6)

## 2014-01-26 LAB — PROTIME-INR
INR: 1.1
PROTHROMBIN TIME: 14.2 s (ref 11.5–14.7)

## 2014-01-26 LAB — MAGNESIUM: Magnesium: 2.2 mg/dL

## 2014-01-26 LAB — TROPONIN I: Troponin-I: <0.02 ng/mL

## 2014-01-27 ENCOUNTER — Inpatient Hospital Stay: Payer: Self-pay | Admitting: Internal Medicine

## 2014-01-28 LAB — CBC WITH DIFFERENTIAL/PLATELET
Basophil #: 0 10*3/uL (ref 0.0–0.1)
Basophil %: 0.2 %
EOS ABS: 0.3 10*3/uL (ref 0.0–0.7)
Eosinophil %: 2.3 %
HCT: 43.7 % (ref 40.0–52.0)
HGB: 14.4 g/dL (ref 13.0–18.0)
LYMPHS ABS: 2 10*3/uL (ref 1.0–3.6)
Lymphocyte %: 17.7 %
MCH: 32.4 pg (ref 26.0–34.0)
MCHC: 32.9 g/dL (ref 32.0–36.0)
MCV: 98 fL (ref 80–100)
MONOS PCT: 10.8 %
Monocyte #: 1.2 x10 3/mm — ABNORMAL HIGH (ref 0.2–1.0)
NEUTROS ABS: 7.8 10*3/uL — AB (ref 1.4–6.5)
NEUTROS PCT: 69 %
Platelet: 198 10*3/uL (ref 150–440)
RBC: 4.44 10*6/uL (ref 4.40–5.90)
RDW: 13.6 % (ref 11.5–14.5)
WBC: 11.4 10*3/uL — ABNORMAL HIGH (ref 3.8–10.6)

## 2014-01-28 LAB — BASIC METABOLIC PANEL
Anion Gap: 8 (ref 7–16)
BUN: 15 mg/dL (ref 7–18)
CALCIUM: 8.7 mg/dL (ref 8.5–10.1)
Chloride: 110 mmol/L — ABNORMAL HIGH (ref 98–107)
Co2: 28 mmol/L (ref 21–32)
Creatinine: 0.85 mg/dL (ref 0.60–1.30)
EGFR (African American): >60
EGFR (Non-African Amer.): >60
Glucose: 100 mg/dL — ABNORMAL HIGH (ref 65–99)
Osmolality: 291 (ref 275–301)
Potassium: 3.5 mmol/L (ref 3.5–5.1)
SODIUM: 146 mmol/L — AB (ref 136–145)

## 2014-01-28 LAB — URINE CULTURE

## 2014-01-29 ENCOUNTER — Ambulatory Visit: Payer: Self-pay | Admitting: Urology

## 2014-01-30 ENCOUNTER — Ambulatory Visit: Payer: Self-pay | Admitting: Internal Medicine

## 2014-02-03 ENCOUNTER — Inpatient Hospital Stay: Payer: Self-pay | Admitting: Internal Medicine

## 2014-02-03 LAB — URINALYSIS, COMPLETE
BACTERIA: NONE SEEN
SPECIFIC GRAVITY: 1.023 (ref 1.003–1.030)
Squamous Epithelial: NONE SEEN
WBC UR: 4 /HPF (ref 0–5)

## 2014-02-03 LAB — COMPREHENSIVE METABOLIC PANEL
Albumin: 3.1 g/dL — ABNORMAL LOW (ref 3.4–5.0)
Alkaline Phosphatase: 69 U/L
Anion Gap: 12 (ref 7–16)
BUN: 89 mg/dL — ABNORMAL HIGH (ref 7–18)
Bilirubin,Total: 1.4 mg/dL — ABNORMAL HIGH (ref 0.2–1.0)
CO2: 25 mmol/L (ref 21–32)
Calcium, Total: 9.1 mg/dL (ref 8.5–10.1)
Chloride: 115 mmol/L — ABNORMAL HIGH (ref 98–107)
Creatinine: 4.17 mg/dL — ABNORMAL HIGH (ref 0.60–1.30)
EGFR (African American): 17 — ABNORMAL LOW
EGFR (Non-African Amer.): 14 — ABNORMAL LOW
Glucose: 111 mg/dL — ABNORMAL HIGH (ref 65–99)
OSMOLALITY: 330 (ref 275–301)
Potassium: 5 mmol/L (ref 3.5–5.1)
SGOT(AST): 23 U/L (ref 15–37)
SGPT (ALT): 17 U/L
SODIUM: 152 mmol/L — AB (ref 136–145)
TOTAL PROTEIN: 7.4 g/dL (ref 6.4–8.2)

## 2014-02-03 LAB — CBC
HCT: 46.9 % (ref 40.0–52.0)
HGB: 15.2 g/dL (ref 13.0–18.0)
MCH: 32.2 pg (ref 26.0–34.0)
MCHC: 32.5 g/dL (ref 32.0–36.0)
MCV: 99 fL (ref 80–100)
Platelet: 95 10*3/uL — ABNORMAL LOW (ref 150–440)
RBC: 4.73 10*6/uL (ref 4.40–5.90)
RDW: 14.1 % (ref 11.5–14.5)
WBC: 14.8 10*3/uL — ABNORMAL HIGH (ref 3.8–10.6)

## 2014-02-03 LAB — APTT: Activated PTT: 41 secs — ABNORMAL HIGH (ref 23.6–35.9)

## 2014-02-03 LAB — TROPONIN I

## 2014-02-03 LAB — PROTIME-INR
INR: 1.5
Prothrombin Time: 18.1 secs — ABNORMAL HIGH (ref 11.5–14.7)

## 2014-03-01 ENCOUNTER — Ambulatory Visit: Payer: Self-pay | Admitting: Internal Medicine

## 2014-03-01 DEATH — deceased

## 2014-07-23 NOTE — Discharge Summary (Signed)
PATIENT NAME:  Anthony [name], Anthony [name] MR#:  [id number] DATE OF BIRTH:  09-15-1915  DATE OF ADMISSION:  02/03/2014 DATE OF DISCHARGE:  02/03/2014  ADMITTING DIAGNOSES:  1.  Acute renal failure, likley due to ATN   2.  Sepsis.   3.  Deep vein thrombosis. 4.  Dementia. 5.  New diagnosis of bladder mass,.  DISCHARGE DIAGNOSES:  1.  Acute respiratory failure due to suspected pulmonary embolism.  2.  Lower extremity deep vein thrombosis. 3.  Gross hematuria with known history of bladder mass.  4.  Questionable bladder outlet obstruction due to bladder mass and acute renal failure.   DISCHARGE CONDITION: Poor.   DISCHARGE MEDICATIONS: The patient is being discharged on Pyridium 100 mg twice daily as needed, Seroquel 25 mg every 8 hours as needed, lorazepam 1 mg daily, finasteride 5 mg daily, lorazepam 0.5 mg 1 tablets sublingually every 3 to 4 hours as needed. Morphine 20 mg/ml oral concentrate 0.25 to 0.5 mL, 5 to 10 mg every 1 to 2 hours as needed. The patient is not to take any other medications.  DIET:  As tolerated.  ACTIVITY:  As tolerated.   FOLLOWUP APPOINTMENTS: None.   CONSULTANTS: Care management, social work, palliative care.   RADIOLOGIC STUDIES: L hip  complete x-ray 02/03/2014 was negative. Chest, portable single view x-ray 02/03/2014 revealed slight increase in right basilar subsegmental atelectasis, minimal left basilar subsegmental atelectasis, which is stable. Bilateral lower extremity Doppler ultrasound 02/04/2014 revealing acute deep vein thrombosis demonstrated throughout the visualized deep veins of the left leg and also contralateral right common femoral vein.   HOSPITAL COURSE:  The patient is a 81 year old Caucasian male with past medical history significant for history of recent admission to the hospital due to altered mental status and apparently agitation, and decreased appetite.  Please refer to Dr. Tinnie Gens admission note on 02/03/2014.  On arrival to the hospital, the  patient's temperature was 97.7, pulse was 101, respiration rate was 22, pulse oximetry was 84% on nonrebreather.  He was tachypneic, tachycardic and confused.    LABORATORY DATA:  Done on the day of admission showed significant abnormalities. The patient's creatinine was found to be 4.17. Sodium was 152 and BUN of 89 as compared to prior BUN and creatinine of 15 and 0.85 on the day on 01/28/2014. The patient's bicarbonate level was found to be 25 and estimated GFR was only 14 as compared to more than 60 on 01/28/2014. The patient's liver enzymes revealed an elevated total bilirubin of 1.4, and albumin level of 3.1, otherwise it was unremarkable. Troponin was less than 0.02. The patient's white blood cell count was elevated to 14.8, hemoglobin was 15.2, platelet count was 95,000.  Comprehensive panel showed ProTime of 18.1, INR was 1.5 and activated PTT was 41.0.  Urinalysis revealed bloody red urine, specific gravity was 1.025.  The patient had 4431 red blood cells, 4 white blood cells and no other abnormalities. Chest x-ray showed minimal atelectasis, bilateral atelectasis.    It was felt that the patient's acute respiratory failure was related likely to acute pulmonary embolism due to bilateral lower extremity deep vein thrombosis.  He was initiated on heparin and consultation with palliative care was obtained.  The patient's family felt that the patient would benefit from hospice home discharge where he will be going.  It was felt that the patient's significant thromboembolism, lower extremity deep vein thrombosis, as well as suspected pulmonary embolism was very likely due to bladder mass, questionable carcinoma and his hematuria  was also related to the same.  On the day of discharge, the patient's vitals, temperature was 97.2, pulse was 113, respiration rate was 20, blood pressure 117/78, saturation was 97% on nonrebreather.     ____________________________ Theodoro Grist, MD rv:DT D: 02/03/2014  17:07:09 ET T: 02/03/2014 17:42:05 ET JOB#: [id number]  cc: Theodoro Grist, MD, <Dictator> Katha Kuehne MD ELECTRONICALLY SIGNED 02/27/2014 19:40

## 2014-07-23 NOTE — H&P (Signed)
PATIENT NAME:  Anthony [name], Anthony [name] MR#:  [id number] DATE OF BIRTH:  Jun 03, 1915  DATE OF ADMISSION:  01/27/2014  REFERRING PHYSICIAN:  Rometta Emery, MD.   CHIEF COMPLAINT: Worsening of the confusion and hypotension.   HISTORY OF PRESENT ILLNESS: Anthony [name] is a 81 year old male with a history of dementia and lives on the Alzheimer unit of a nursing home has been in the last 2 days at [facility] for increasing confusion and was discharged. At the nursing home, the patient was found to be hypotensive with chills and rigors.  Concerning this, the patient was sent to the Emergency Department. Work-up in the Emergency Department, the patient was found to have on chest x-ray concerning about aspiration pneumonia. The patient received levofloxacin in the Emergency Department. According to the daughter the patient at baseline is able to carry on some conversation, however, in the last 5 days, the patient's confusion has been significantly worsening associated with hallucinations and delusions, unable to obtain any history from the patient. The patient is pleasantly confused. At [facility] the patient was told that the patient might be having transitional cell CA concerning the patient's hematuria, however no further work-up was done. The patient's daughter is requesting for further evaluation by urology.    PAST MEDICAL HISTORY: Dementia.   ALLERGIES: No known drug allergies.   HOME MEDICATIONS:  1. Vitamin D3 400 units once a day.  2. Triple antibiotic.  3. Tramadol 50 mg every 8 hours as needed.  4.    100 mg every 6 hours as needed.  5. Milk of magnesia 30 mL once a day.  6. Lorazepam 1 mg every 24 hours.  7.    2 mg capsules orally up to 8 times.  8. Finasteride 5 mg once a day.  9. Depakote 125 mg 3 capsules 2 times a day. 10. Cephalexin 500 mg 4 times a day.   SOCIAL HISTORY: No history of smoking, drinking alcohol or using illicit drugs. Currently lives in the Alzheimer unit.   FAMILY HISTORY:  Noncontributory at the age of 27.   REVIEW OF SYSTEMS: Could not be obtained secondary to the patient's dementia.   PHYSICAL EXAMINATION:  GENERAL: This is a well-developed, well-nourished, age-appropriate male lying down in the bed, not in distress. Pleasantly confused.  VITAL SIGNS: Temperature 98.1, pulse 84, blood pressure 111/62, respiratory rate of 21, oxygen saturation is 94% on room air.  HEENT: Head normocephalic, atraumatic. There is no scleral icterus. Conjunctivae normal. Pupils equal and react to light. Mucous membranes moist. Could not examine oropharynx.  NECK: Supple. No lymphadenopathy. No JVD. No carotid bruit. No thyromegaly.  CHEST: Has no focal tenderness. Decreased breath sounds in the lower lobes. No coarse breath sounds or rhonchi.  HEART: S1, S2 regular. No murmurs are heard.  ABDOMEN: Bowel sounds present. Soft, nontender, nondistended. No hepatosplenomegaly.  EXTREMITIES: No pedal edema. Pulses 2+.  SKIN: No rash or lesions.  MUSCULOSKELETAL: Good range of motion in all the extremities.  NEUROLOGIC: The patient is alert, oriented to self but not to place, and time. No apparent cranial nerve abnormalities. Motor 5/5 in upper and lower extremities.   LABORATORY DATA: Urinalysis positive for nitrates, RBC of 946: WBC of 10. CT head without contrast: No acute intracranial abnormality.   Chest x-ray, 1 view portable: Lung base opacities are nonspecific atelectasis.   CT chest without contrast shows bibasilar opacities.  Central lobular emphysema.   ASSESSMENT AND PLAN:  1. Aspiration pneumonia. Keep the patient on vancomycin  and Zosyn as the patient is a nursing home resident. Will keep the patient n.p.o. continue with intravenous fluids.  2. Dysphagia. Most likely oropharyngeal. Get swallow evaluation in the morning.  3. Dementia with behavior disturbances. We will keep the patient on Seroquel at bedtime. Will discontinue the Ativan which increases the risk of falls.   The patient is also on Depakote.   4. Hematuria. Consult urology.  However, considering the patient's advanced age treatment could be limited.  Discussed with the patient's daughter, however, the patient's daughter wants him to be evaluated by the urology.   5. Urinary tract infection. The patient has nitrites positive, WBC of 10 with no bacteria seen. We will obtain urine cultures. The patient is already on broad-spectrum antibiotics.  6. Keep the patient on deep vein thrombosis prophylaxis with sequential compression devices.   TIME SPENT: 50 minutes.    ____________________________ Monica Becton, MD pv:JT D: 01/27/2014 03:28:11 ET T: 01/27/2014 04:08:31 ET JOB#: [id number]  cc: Monica Becton, MD, <Dictator> Monica Becton MD ELECTRONICALLY SIGNED 01/29/2014 23:24

## 2014-07-23 NOTE — Discharge Summary (Signed)
PATIENT NAME:  Anthony [name], Anthony [name] MR#:  [id number] DATE OF BIRTH:  Nov 13, 1915  DATE OF ADMISSION:  01/27/2014 DATE OF DISCHARGE:  01/30/2014  ADMISSION DIAGNOSIS: Aspiration pneumonia.   DISCHARGE DIAGNOSES: 1. Aspiration pneumonia.  2. Dementia with behavior disturbance.  3. Hematuria.  4. Urinary tract infection.   CONSULTATIONS:  1. Dr. Maudie Mercury from urology.  2. Palliative care.   LABORATORY DATA: White blood cells 11.4, hemoglobin 14, hematocrit 44, platelets 198,000. Sodium 145, potassium 3.5, chloride 110, bicarbonate 28, BUN 15, creatinine 0.85, glucose 100.   Urine culture, no growth.   HOSPITAL COURSE: A 81 year old male, who presents on October 29 with dementia worsening confusion and hypotension, found to have aspiration pneumonia. For further details, please refer to the history and physical.   1. Aspiration pneumonia. The patient was placed on oxygen and started on Zosyn. A speech consult was obtained. The patient had no evidence of dysphagia per a swallow evaluation. He is on a pureed diet, aspiration precautions, off his oxygen and changed to A ugmentin at discharge.  2. Dementia with behavior disturbance. The patient is on Ativan. He may need some p.r.n. Seroquel, as well.  3. Hematuria. The patient had an ultrasound on 01/16/2014, which revealed a 5 cm lobulated mass at the posterior wall of the bladder with demonstrable vascular flow and Doppler concerning for neoplasm. Urology was therefore consulted due to the patient's daughter wanting a consultation. Dr. Maudie Mercury did speak with the daughter. The gross hematuria is likely due to his large bladder neoplasm detected in the right upper quadrant abdominal ultrasound. No indication for urgent urological evaluation. In the context of her father's significant comorbidities including severe dementia, advanced age, at this time, no intervention is recommended, and she was made aware that there could be much potential variation in the patient's  clinical course from intermittent to constant gross hematuria. The daughter may want outpatient follow-up with a diagnostic cystoscopy in the future once his acute issues have completely resolved, and if he should return to his previous baseline.  4. Dementia with behavioral disorder. Palliative care was consulted. The patient was continued on his Ativan and he had a sitter while in the hospital.  5. Urinary tract infection. Urine culture showed no growth. He will continue on Augmentin.   DISCHARGE MEDICATIONS:  1. Ativan 1 mg in the morning.  2. Loperamide 2 mg q.8 h. p.r.n.  3. Mi-Acid 30 mL 4 times a day p.r.n. indigestion.  4. lactulose 30 mL at bedtime p.r.n.  5. Q-Tussin 1 tsp  q.6 h. p.r.n.  6. linderm apply to affected area 4 times a day.  7. Triple antibiotic 4 times a day p.r.n.  8. Finasteride 5 mg daily.  9. Tramadol 50 mg q.8 h. p.r.n. 10. Cranberry capsule 2 tablets daily.  11. Vitamin D3 400 international units daily.  12. Pyridium 100 mg as needed for pain.  13. Ativan 1 mg q.24 h. p.r.n.  14. Augmentin 1 tablet p.o. b.i.d. x 7 days.   DISCHARGE DIET: Regular pureed, thin liquids, aspiration precautions, medications in puree for safer swallowing.   DISCHARGE FOLLOWUP: The patient can follow up with the M.D. at the facility. The patient is medically stable for discharge today, 01/30/2014.   ____________________________ Islah Eve P. Benjie Karvonen, MD spm:JT D: 01/30/2014 10:13:06 ET T: 01/30/2014 12:15:43 ET JOB#: [id number]  cc: Chipper Koudelka P. Benjie Karvonen, MD, <Dictator> Donell Beers Beryle Bagsby MD ELECTRONICALLY SIGNED 01/31/2014 11:14

## 2014-07-23 NOTE — Consult Note (Signed)
Urology Consultation Report  Reason for Consultation: Gross Hematuria with Probable Bladder Mass on Ultrasound  Requesting MD: Monica Becton, M.D. Consulting MD: Darcella Cheshire, M.D.  HPI: 81 y.o. WWM with dementia, resident on the [facility] of a local SNF, admitted 03/29/2014 with aspiration pneumonia and worsening confusion/hypotension.  Pt has developed gross hematuria over the past few weeks.  RUS on 01/16/2014 revealed a 5cm lobulated mass at the posterior wall of the bladder with demonstrable vascular flow on doppler concerning for a neoplasm rather than clot; no hydronephrosis or solid renal masses detected (bilateral simple cysts present).  Pt is incontinence into diapers with bladder scan on 01/28/2014 confirming the absence of urinary retention at 146 mL. UA on 01/26/2014 was grossly red with 946 rbc/hpf (10 wbc) with Urine Culture returning negative.  The Hct has remained stable at 43.6 and 43.7% with stable vital signs.  Urology is being consulted at the request of the pt's daughter.  PMH/PSH/FH/SH/ROS as per the detailed H&P by Dr. Lunette Stands dated 01/27/2014.  Exam: T (97.25F), P (108), RR (17), BP (139/81), O2 Sat (87% on RA) Gen: WD, thin WM in NAD, sleepy, arousable for brief periods only - unable to answer questions or follow commands Skin: Warm/Dry without lesions about the Head and Neck HEENT: [place]/AT, anicteric Neck: no masses/bruits Chest: CTA anteriorly with coarse breath sounds and nL respiratory effort CV: RRR without M/G/R, 1+ pulses b/L Abd: NT/ND, no palpable masses/organomegaly, no CVAT GU: nL uncirc phallus without lesion; b/L descended testes without masses/NT Ext: NT, no edema Neuro: non-focal Psych: Somnolent, but arousable, unable to answer questions or follow commands  Labs/Imaging Studies per HPI  Assessment:  1. Gross Hematuria - likely due to a large bladder neoplasm detected on RUS 01/16/2014 -hemodynamically stable without anemia or urinary  obstruction -no indication for urgent urologic intervention  2. AbnL RUS with suspected large intravesical bladder neoplasm (see #1) -definitive diagnosis would require diagnostic cystoscopy, which could be performed after resolution of his acute clinical condition, if desired, either as an office procedure, if the pt is able to cooperate, or under anesthesia, if unable to cooperate  Discussed the above with the patient's daughter, Assunta Gambles ([phone]), along with the natural history of urothelial carcinoma of the urinary bladder, in the context of her father's significant co-morbidities and advanced age.  Ms. Elizabeth Palau understands that there is much potential variation in her father's clinical course going forward, ranging from intermittent to constant gross hematuria, +/- urinary obstruction due to clot, to the development of invasive disease with marked worsening in hematuria and the development of significant local symptoms and metastatic disease with an 80% 2 yr mortality for untreated invasive bladder cancer.  Recommend:  1. No Urologic Intervention at this time. 2. Should the pt recover to his previous baseline, the pt's daughter would like to re-visit the options, including diagnostic cystoscopy, with [employer]  Will sign off. Please reconsult, if needed.  Electronic Signatures: Darcella Cheshire (MD)  (Signed on 31-Oct-15 10:35)  Authored  Last Updated: 31-Oct-15 10:35 by Darcella Cheshire (MD)

## 2014-07-23 NOTE — H&P (Signed)
PATIENT NAME:  Anthony [name], LYNESS MR#:  [id number] DATE OF BIRTH:  1915-05-21  DATE OF ADMISSION:  02/03/2014  REFERRING PHYSICIAN:  Valli Glance. Owens Shark, MD   PRIMARY CARE PHYSICIAN:  Nonlocal  ADMISSION DIAGNOSIS:  Acute kidney injury.   HISTORY OF PRESENT ILLNESS:  This is a 81 year old white male who presents to the Emergency Department via EMS. His caretakers called the ambulance because they were concerned about his decreased level of alertness and apparent agitation. They noted the patient had a decreased appetite for the last week and that he appeared to be more easily agitated by the staff touching him while performing nursing duties. They also had mentioned that the patient's speech had become like a whisper and that he no longer tried to read the newspaper or have conversations as he once did. The skilled nursing home staff have noticed a precipitous decline in his mentation and were concerned that this was impairing his p.o. intake, possibly leading to some dehydration. In the Emergency Department, laboratory evaluation revealed acute kidney injury, which prompted the Emergency Department to call for admission.   REVIEW OF SYSTEMS:  The patient has baseline dementia and at this time is noncommunicative and thus cannot participate in his own history.   PAST MEDICAL HISTORY:  Provided by family:  Dementia, recurrent falls, and new prostate mass. Until the last 2 years, the patient has had no chronic medical conditions and has not taken any medications.    PAST SURGICAL HISTORY:  None.   FAMILY HISTORY:  There are no chronic diseases through the family.   SOCIAL HISTORY:  The patient lived on his own until last year. He does not smoke, drink, or do any drugs.   MEDICATIONS: 1.  Amoxicillin with clavulanic acid 875 mg/125 mg tablet 1 tab b.i.d. x 7 days; this medicine is complete.  1.  Cranberry capsules 2 capsules p.o. once daily.  2.  Finasteride 5 mg 1 tab p.o. daily.  3.  Loperamide 2 mg  1 capsule p.o. up to 8 times a day as needed for diarrhea.  4.  Lorazepam 1 mg 1 tablet p.o. daily every morning (the patient also has the same dose ordered every 24 hours as needed for anxiety).  5.  Milk of Magnesia 30 mL 4 times a day as needed for indigestion or heartburn.  6.  Pyridium 100 mg 1 tablet p.o. daily as needed for pelvic pain.  7.  Robitussin 100 mg per 5 mL 10 mL every 6 hours as needed for cough.  8.  Quetiapine 25 mg 1 tablet p.o. every 8 hours as needed for agitation.  9.  Risamine 0.44%/20.625% topical ointment applied topically to the affected area every 4 hours as needed for redness.  10.  Tramadol 50 mg 1 tablet p.o. every 8 hours as needed for pain.  11.  Triple antibiotic ointment applied topically to affected area 4 times a day.  12.  Vitamin D3, 400 international units 1 tablet p.o. once daily.   ALLERGIES:  No known drug allergies.   PERTINENT LABORATORY RESULTS AND RADIOGRAPHIC FINDINGS:  Serum glucose is 111, BUN 89, creatinine 4.17, sodium 152, potassium 5, chloride 115, bicarbonate 25, calcium 9.1, serum albumin 3.1, alkaline phosphatase 69, AST 23, and ALT 17. Troponin is negative. White blood cell count is 14.8, hemoglobin 15.2, hematocrit 46.9. INR is 1.5. Urinalysis shows 4 white blood cells per high-powered field. Urine specimen was grossly bloody.   PHYSICAL EXAMINATION: VITAL SIGNS:  Temperature 97.7, pulse 101,  respirations 22, pulse oximetry 84% on a nonrebreather mask.  GENERAL:  The patient is sleeping comfortably, in no apparent distress.  HEENT:  Normocephalic, atraumatic. Pupils are equal, round, and reactive to light and accommodation. Mucous membranes are tacky.  NECK:  Trachea is midline. No adenopathy.  CHEST:  Symmetric and atraumatic.  CARDIOVASCULAR:  Tachycardic but regular rhythm. Normal S1 and S2. No rubs, clicks, or murmurs appreciated.  LUNGS:  Clear to auscultation bilaterally. Normal effort and excursion.  ABDOMEN:  Positive bowel  sounds. Soft, nontender, nondistended. No hepatosplenomegaly.  GENITOURINARY:  Normal external male genitalia.  MUSCULOSKELETAL:  The patient moves all 4 extremities equally but does not participate in strength testing.  EXTREMITIES:  No clubbing, cyanosis, or edema.  SKIN:  No rashes or lesions, but the patient does have some bruising on his hips from a fall sustained last week.  NEUROLOGIC:  The patient does not participate in a full neurologic exam, but it is clear that he has a resting tremor in his upper extremities when I try to position his hands in such a way to examine him.  PSYCHIATRIC:  Mood nor affect can be assessed.   ASSESSMENT AND PLAN:  This is a 81 year old male admitted for acute kidney injury.   1.  Acute kidney injury. The patient has not been eating for at least a week and is clearly dehydrated. I have started intravenous fluid at 1-1/2 maintenance rate. The patient does not have congestive heart failure, but we will monitor his respiratory status and adjust fluid resuscitation appropriately. His baseline creatinine is 0.85.  2.  Hypoxemia. The patient was placed on a nonrebreather mask in the Emergency Department, although it should be noted that the waveform on pulse oximetry was not consistent nor was it good, indicating that an accurate reading was not obtained. His lungs are clear. He has a normal respiratory rate. The patient has a history of aspiration pneumonia but did not show any signs or symptoms of pneumonia at the time of exam. Prior to the dictation of this note, the floor nurses called to inform me that the patient's pulse oximetry waveform was much better, but his oxygen saturations were still low. This prompted me to order a chest x-ray, and we will plan on using BiPAP as a noninvasive and indirect method to improve his oxygenation, as the patient is a DO NOT RESUSCITATE. We do not want to escalate care per the family's wishes, but technically they have not yet made  him palliative care. If his respiratory status declines, we will try the noninvasive positive pressure ventilation and determine from that point if we will adjust settings based on blood gas results or how the patient is doing clinically.  3.  Sepsis. The patient meets criteria based on leukocytosis as well as heart rate. It is not clear at this time that the patient actually has an infection, although if it becomes more evident that he has a source of septicemia, aspiration pneumonia would be a likely etiology. I have not started antibiotics nor obtained any blood cultures on the patient at this point, as malignancy may also produce some leukocytosis, as well as the patient's current state of dehydration. We will continue to monitor for signs or symptoms of septicemia.  4.  Deep vein thromboses. The patient has an arguably swollen right leg, although there is no edema. This prompted the Emergency Department to order bilateral Dopplers, which show DVTs in both legs. I have placed the patient on a  treatment dose of heparin.  5.  Dementia. We will continue the patient's home medicines if he is able to swallow. If aspiration pneumonia is determined to be the cause of this current episode, it may be more reasonable to switch any of his mood stabilizers to intramuscular injections or IV sedatives if the family wishes to make the patient comfort care.  6.  Prostate mass. It is likely a neoplasm. I will continue finasteride for now.  7.  Gastrointestinal prophylaxis. Currently, I have not started the patient on a proton pump inhibitor. We will initiate reflux precautions to reduce the likelihood of further aspiration. I have ordered a swallow evaluation as well.  8.  Plan of care. The family is to discuss their wishes for comfort care. I have not yet ordered a palliative care consult, but they will share this with the primary care team in the morning.   CODE STATUS:  The patient is a DO NOT RESUSCITATE. He does not  want to be coded in the event of cardiopulmonary arrest.   Time Spent on patient care and admission orders:  Approximately 45 minutes.    ____________________________ Norva Riffle. Marcille Blanco, MD msd:nb D: 02/03/2014 05:47:16 ET T: 02/03/2014 06:51:59 ET JOB#: [id number]  cc: Norva Riffle. Marcille Blanco, MD, <Dictator> Norva Riffle Sarayu Prevost MD ELECTRONICALLY SIGNED 02/04/2014 0:03

## 2014-07-23 NOTE — Discharge Summary (Signed)
PATIENT NAME:  Anthony [name], Anthony [name] MR#:  [id number] DATE OF BIRTH:  10-09-1915  DATE OF ADMISSION:  01/27/2014 DATE OF DISCHARGE:  01/31/2014  ADDENDUM:    The family wanted the patient to be placed in different skilled nursing facility.  The only medication change is that I added Seroquel 25 mg p.o. q.8h. p.r.n. for agitation.   Everything else  remains the same.     ____________________________ Donell Beers. Benjie Karvonen, MD spm:jp D: 01/31/2014 08:53:53 ET T: 01/31/2014 09:29:14 ET JOB#: [id number]  cc: Tomie Elko P. Benjie Karvonen, MD, <Dictator> Donell Beers Janee Ureste MD ELECTRONICALLY SIGNED 01/31/2014 11:14

## 2016-02-14 IMAGING — US US RENAL KIDNEY
1 series · 14 of 25 positions shown · non-contrast
Comparison: None.

CLINICAL DATA: Gross hematuria for 2 days.  Initial encounter

EXAM:
RENAL/URINARY TRACT ULTRASOUND COMPLETE

[Series 1: us renal kidney · 0.25mm/px · 14 of 43 slices shown]
[im 1/43]
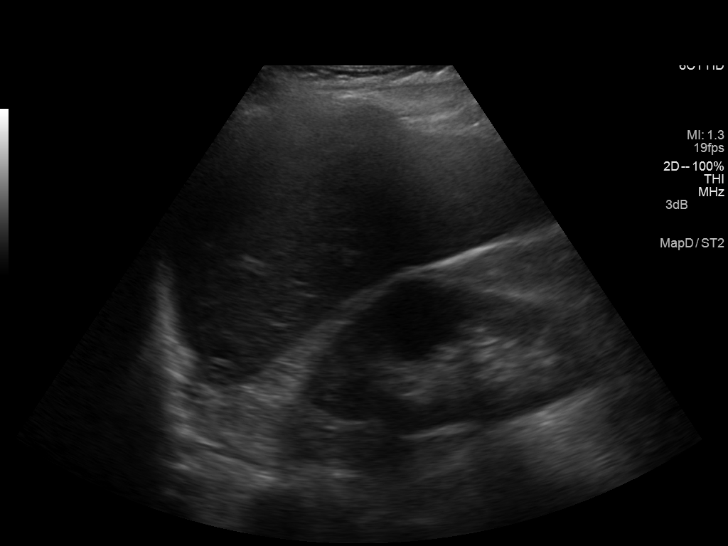
[im 4/43]
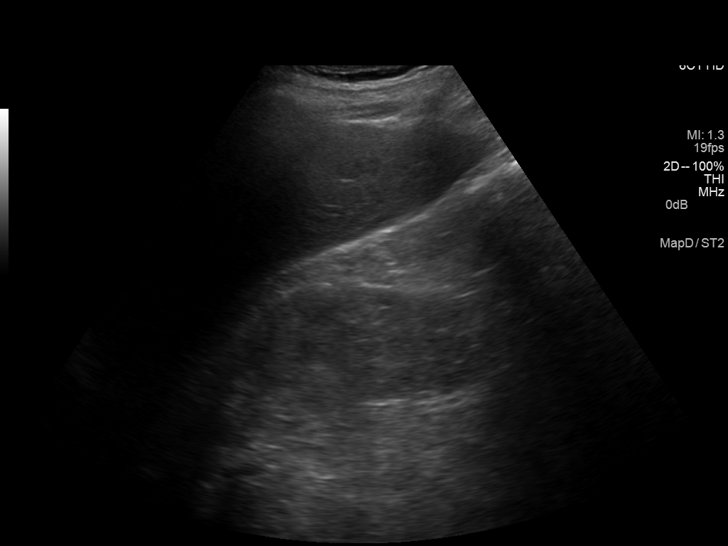
[im 8/43]
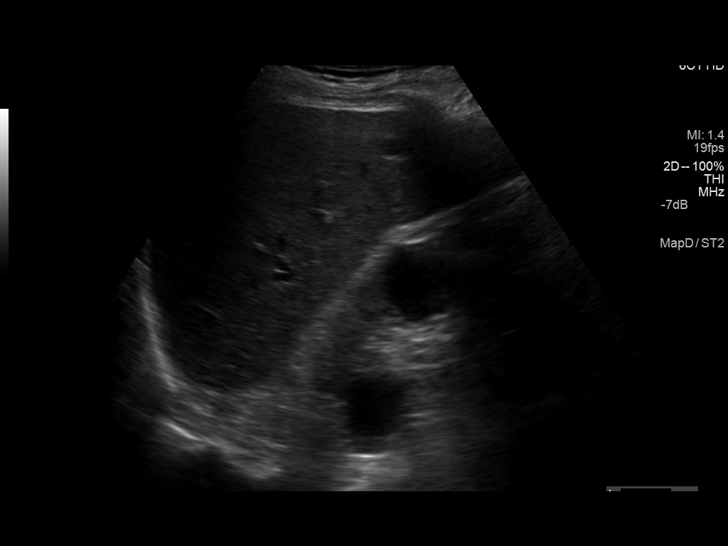
[im 11/43]
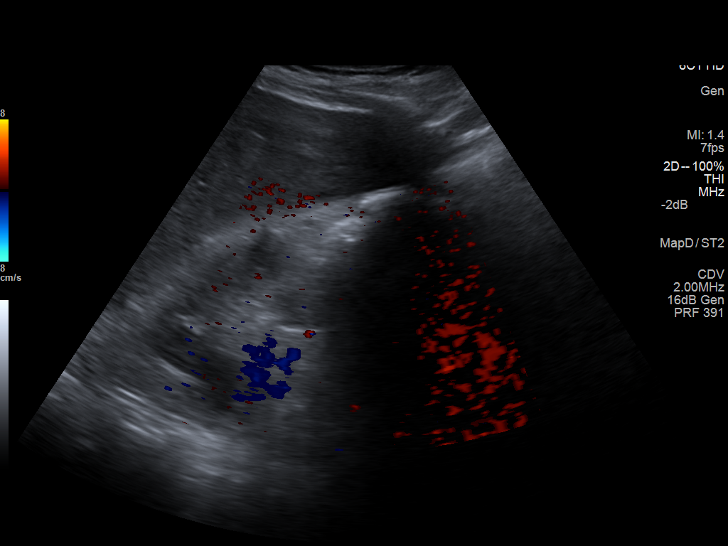
[im 15/43]
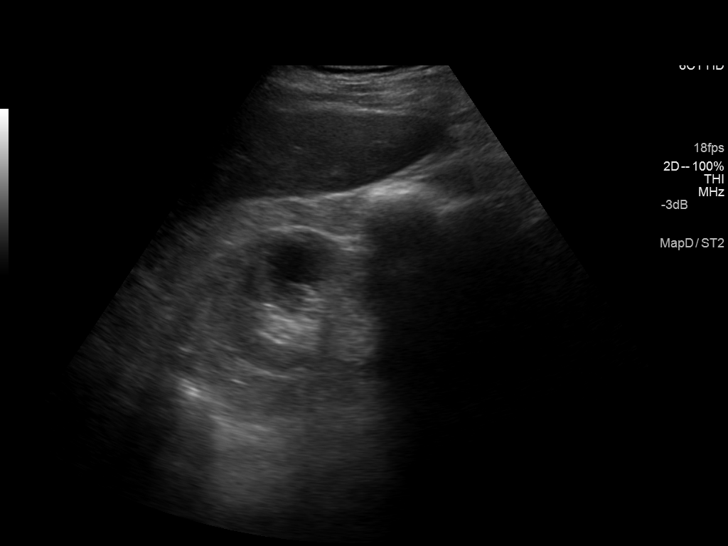
[im 16/43]
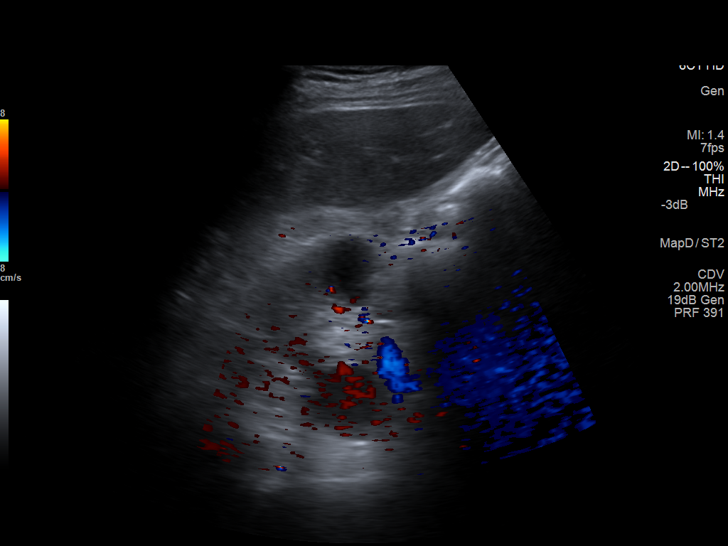
[im 20/43]
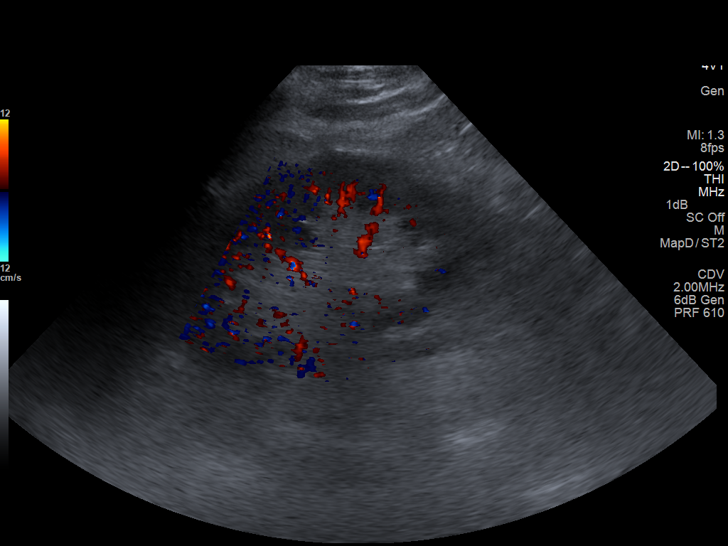
[im 23/43]
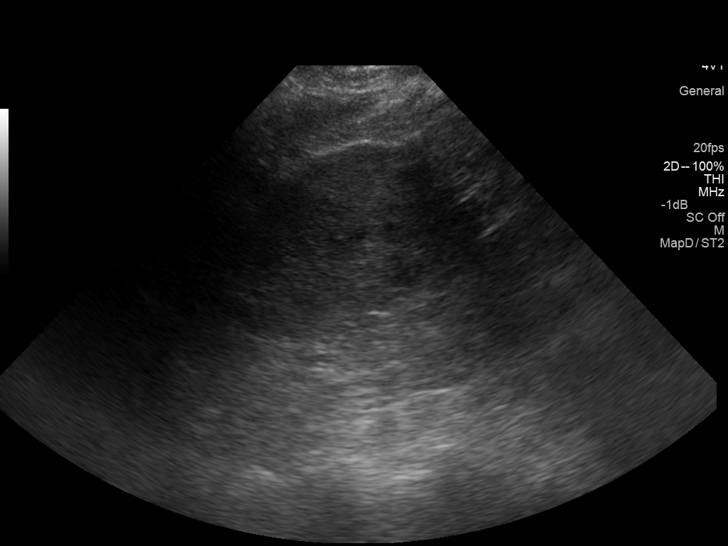
[im 27/43]
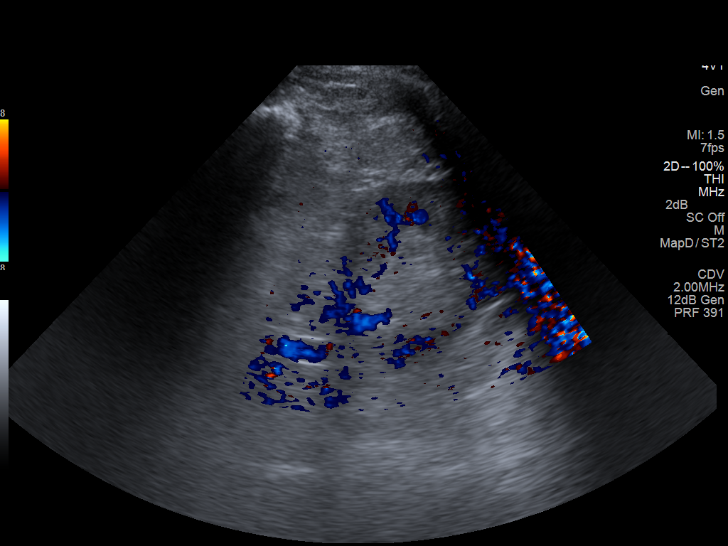
[im 29/43]
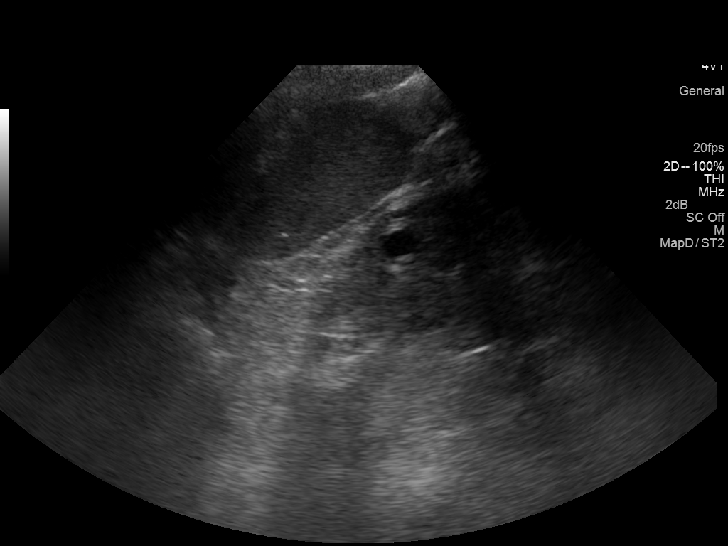
[im 32/43]
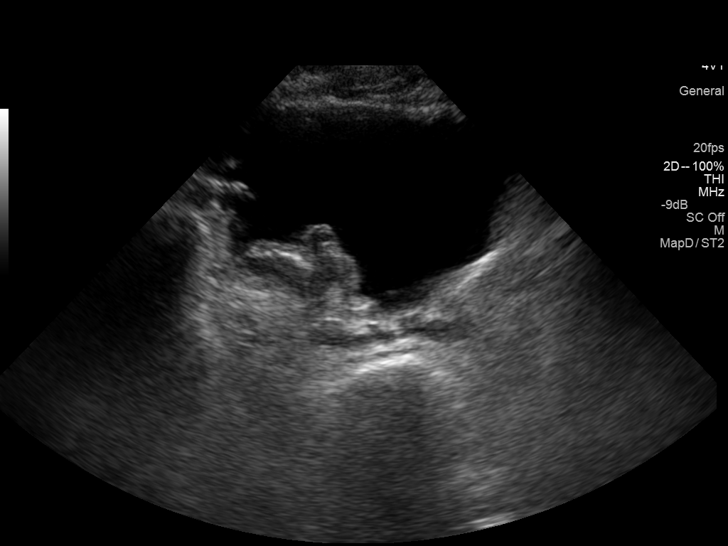
[im 36/43]
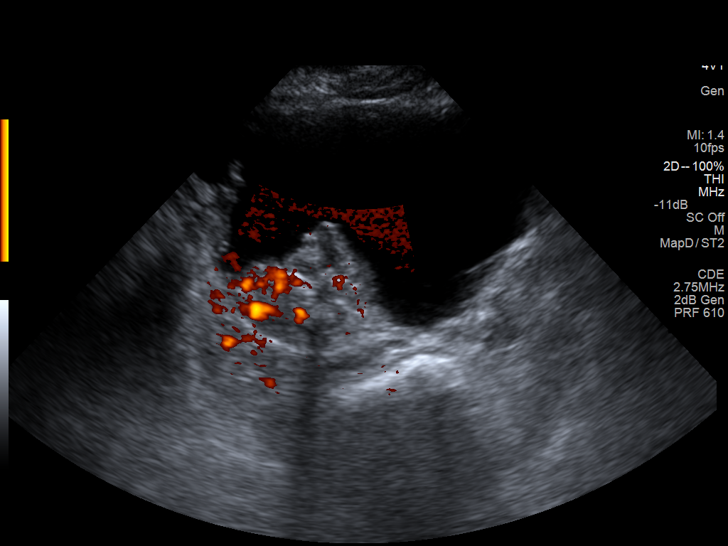
[im 39/43]
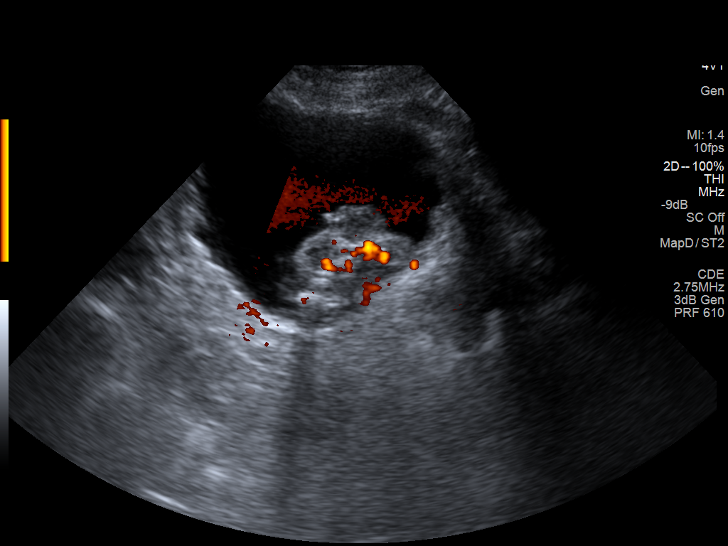
[im 43/43]
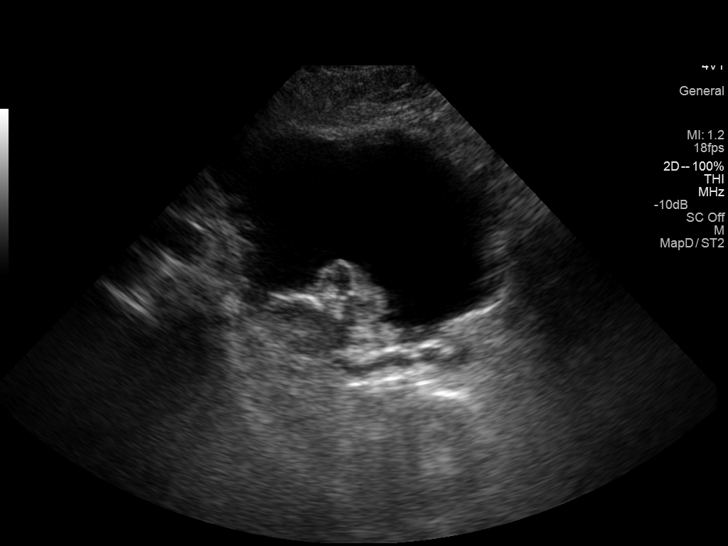

[14 of 25 positions shown; findings below may reference images not displayed]

FINDINGS: Right Kidney:

Length: 10 cm. Two simple appearing cysts are present within the
upper pole, the largest measuring 3 cm. No hydronephrosis. No
evidence of solid mass.

Left Kidney:

Length: 11 cm. 12 mm cyst noted in the upper pole. No
hydronephrosis. No evidence of solid mass.

Bladder:

Lobulated mass along the posterior bladder wall measuring up to 5 cm
in diameter. Internal color Doppler flow is present, consistent with
tumor rather than blood clot. There is evidence of shadowing
calcifications. Changes of chronic bladder outlet obstruction with
cellules and diverticula.
IMPRESSION: 1. 5 cm bladder mass, likely a urothelial carcinoma.
2. Chronic outlet obstruction with diverticula.
3. Bilateral renal cysts. No upper tract findings to explain
hematuria.

## 2016-02-24 IMAGING — CT CT HEAD WITHOUT CONTRAST
1 series · 16 of 30 positions shown, 20 images · non-contrast
Comparison: 01/25/2014

CLINICAL DATA: Dementia, tremors.

EXAM:
CT HEAD WITHOUT CONTRAST
TECHNIQUE: Contiguous axial images were obtained from the base of the skull
through the vertex without intravenous contrast.

[Series 2: head wo · axial · 0.45mm/px · z∈[+367,+511]mm · 16 of 36 slices shown, 20 images]
[im 2/36  brain]
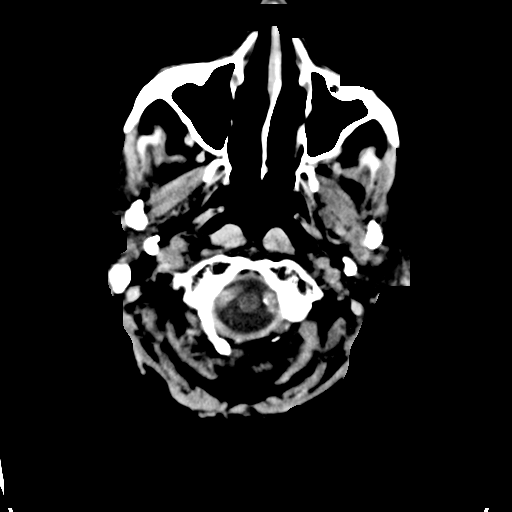
[im 2/36  bone]
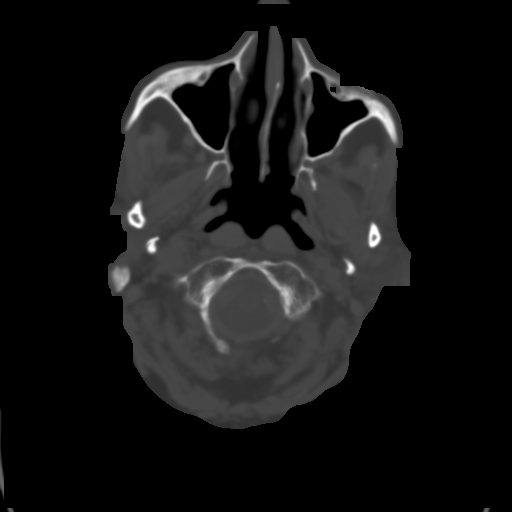
[im 4/36  brain]
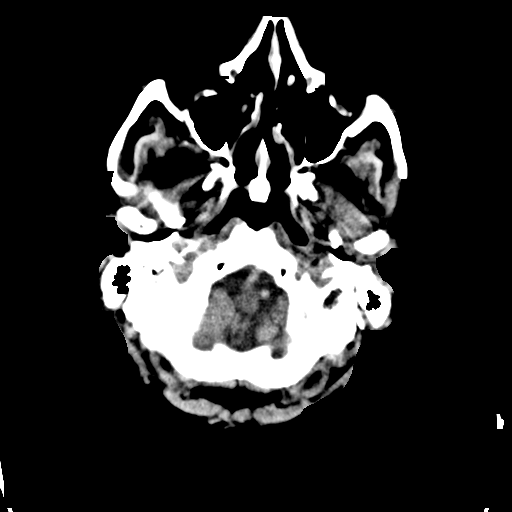
[im 7/36  brain]
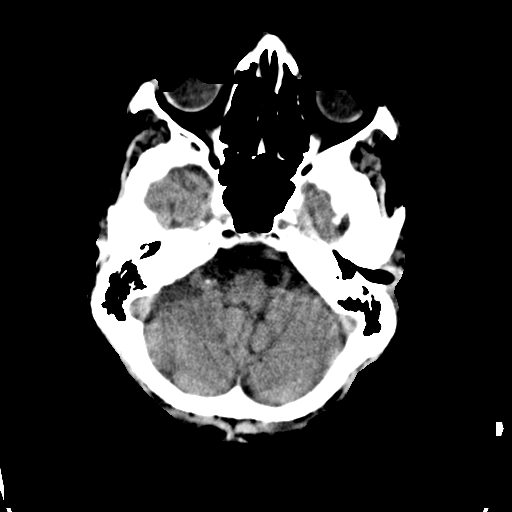
[im 9/36  brain]
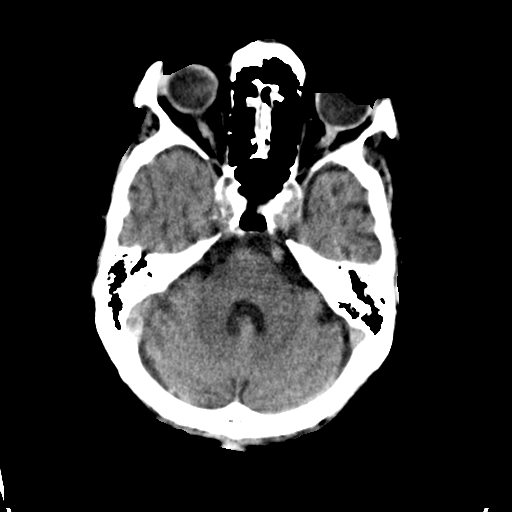
[im 10/36  brain]
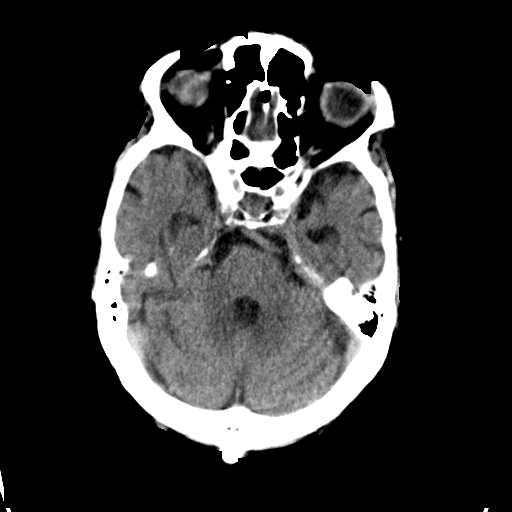
[im 10/36  bone]
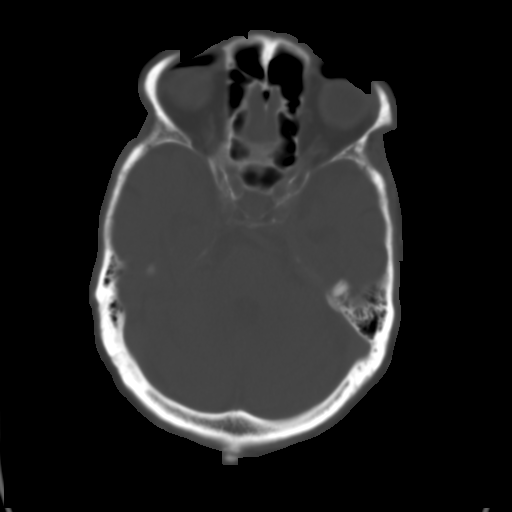
[im 13/36  brain]
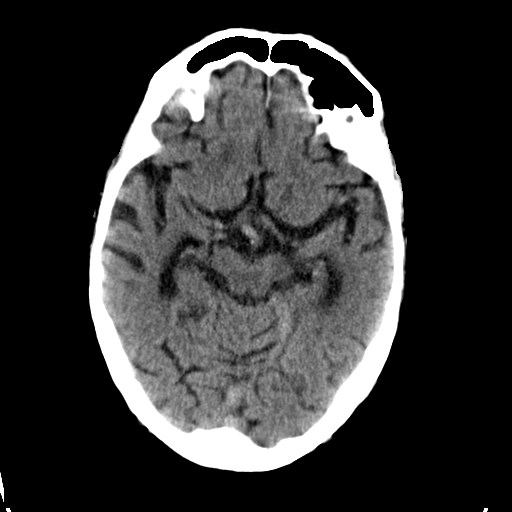
[im 15/36  brain]
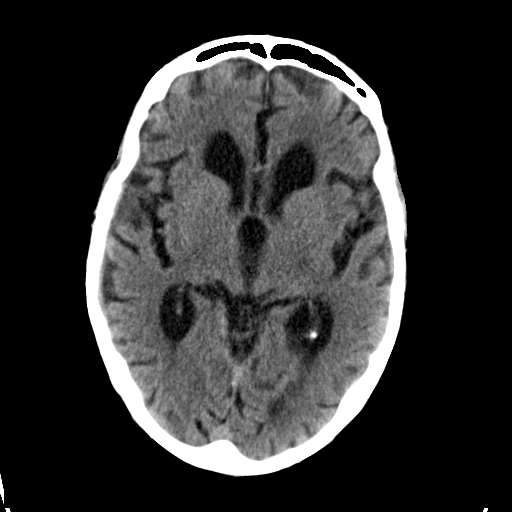
[im 17/36  brain]
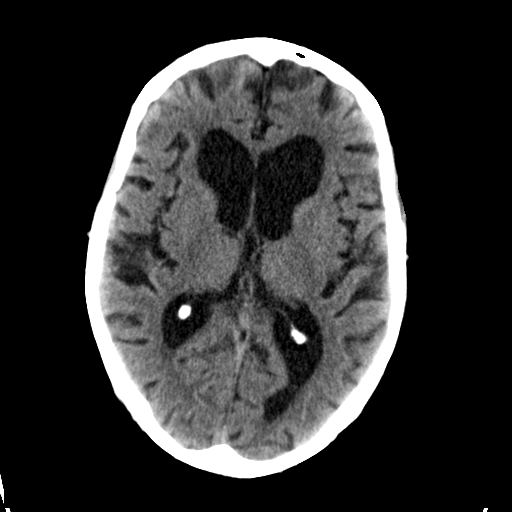
[im 19/36  brain]
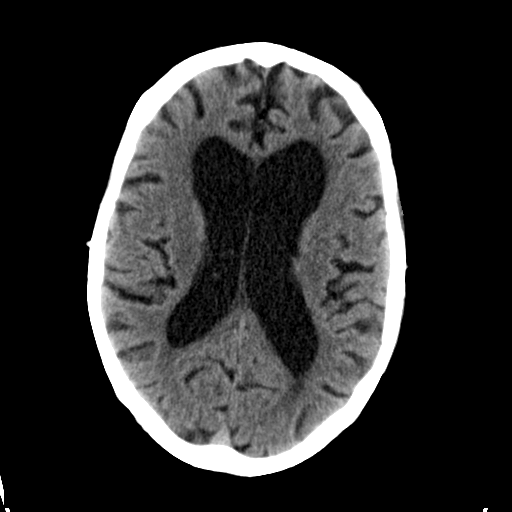
[im 19/36  bone]
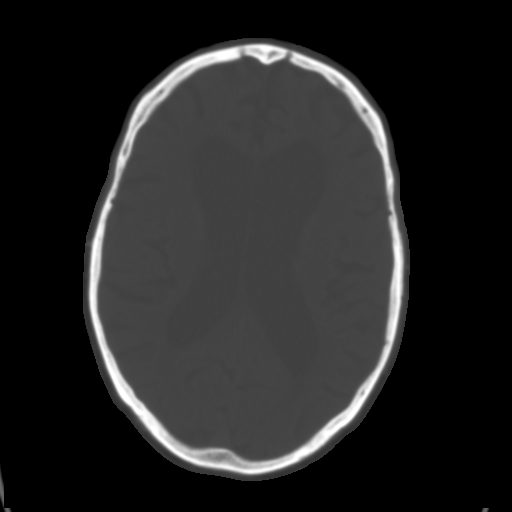
[im 21/36  brain]
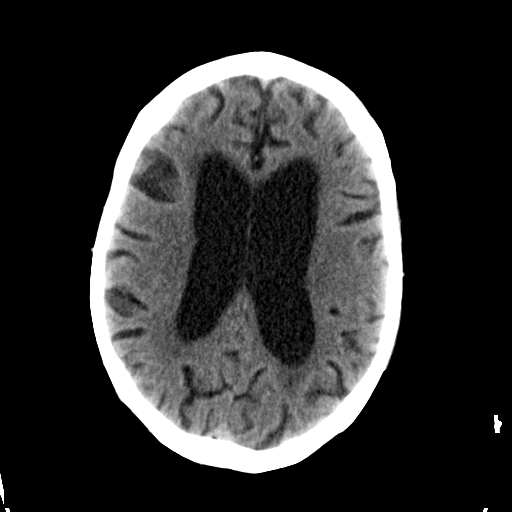
[im 23/36  brain]
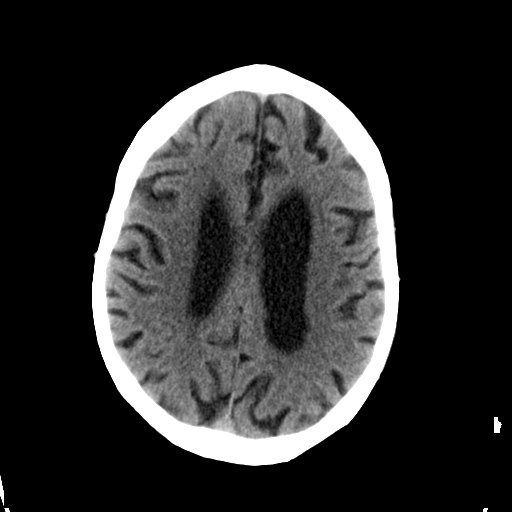
[im 26/36  brain]
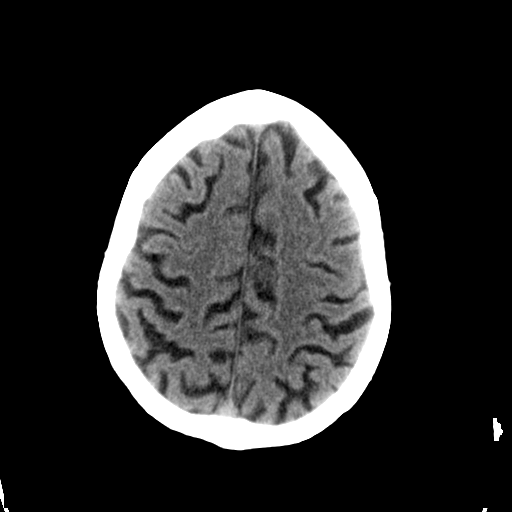
[im 27/36  brain]
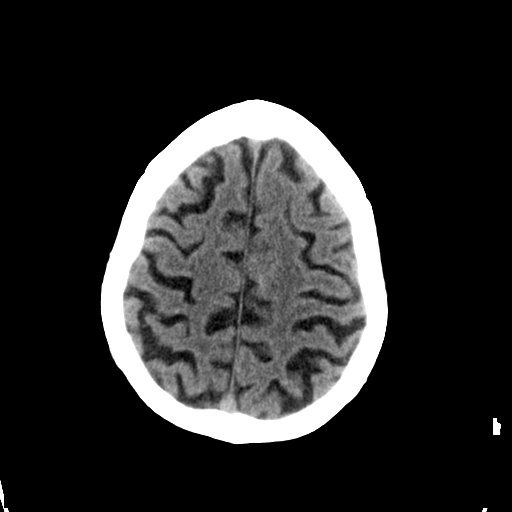
[im 27/36  bone]
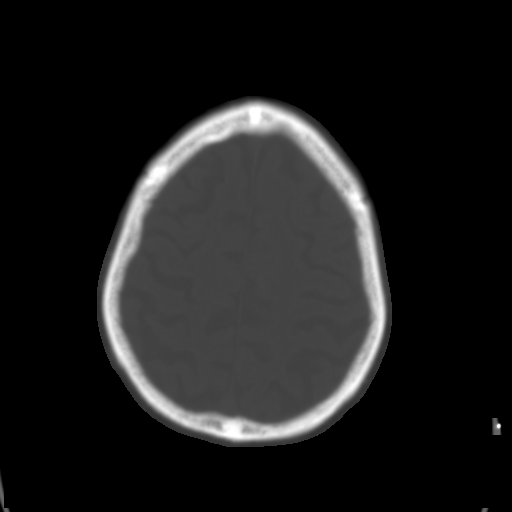
[im 29/36  brain]
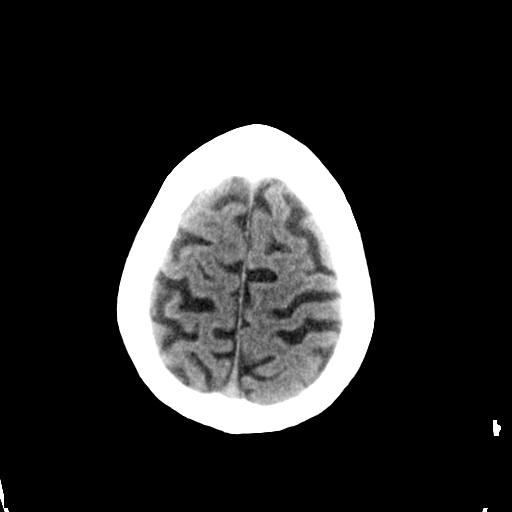
[im 32/36  brain]
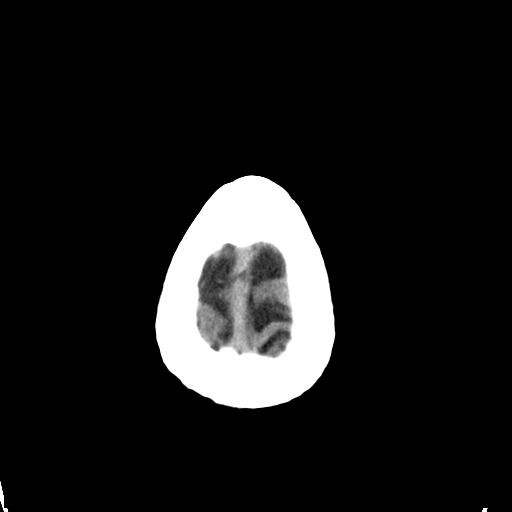
[im 34/36  brain]
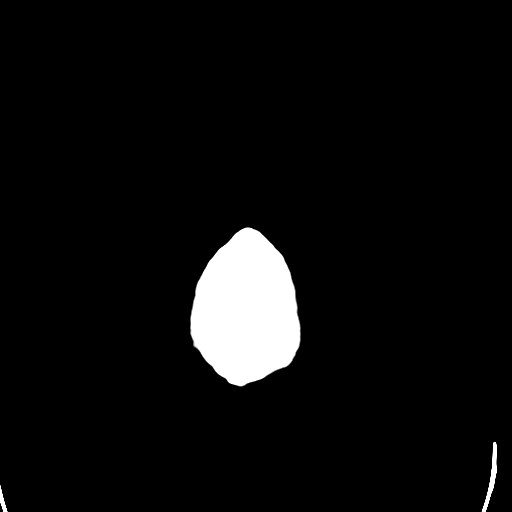

[16 of 30 positions shown; findings below may reference images not displayed]

FINDINGS: Volume loss with ventriculomegaly, similar to prior. Mild white
matter changes, similar prior. No definite CT evidence of an acute
infarction. No intraparenchymal hemorrhage. No mass, mass effect, or
abnormal extra-axial fluid collection. The visualized paranasal
sinuses and mastoid air cells are predominantly clear. Evidence
prior sinus surgery. No displaced calvarial fracture.
IMPRESSION: No acute intracranial abnormality.

Ventriculomegaly may be secondary to central atrophy or NPH, similar
pattern to priors.

## 2016-03-02 IMAGING — CR DG HIP COMPLETE 2+V*L*
1 series · 3 of 3 positions shown · non-contrast
Comparison: Pelvic radiograph March 03, 2013.

CLINICAL DATA: [REDACTED] patient, altered mental
status, with 6 falls over last 2 months. History of bladder tumor.

EXAM:
LEFT HIP - COMPLETE 2+ VIEW

[Series 1: t pelvis ap · 0.14mm/px · 3 of 3 slices shown]
[im 1/3]
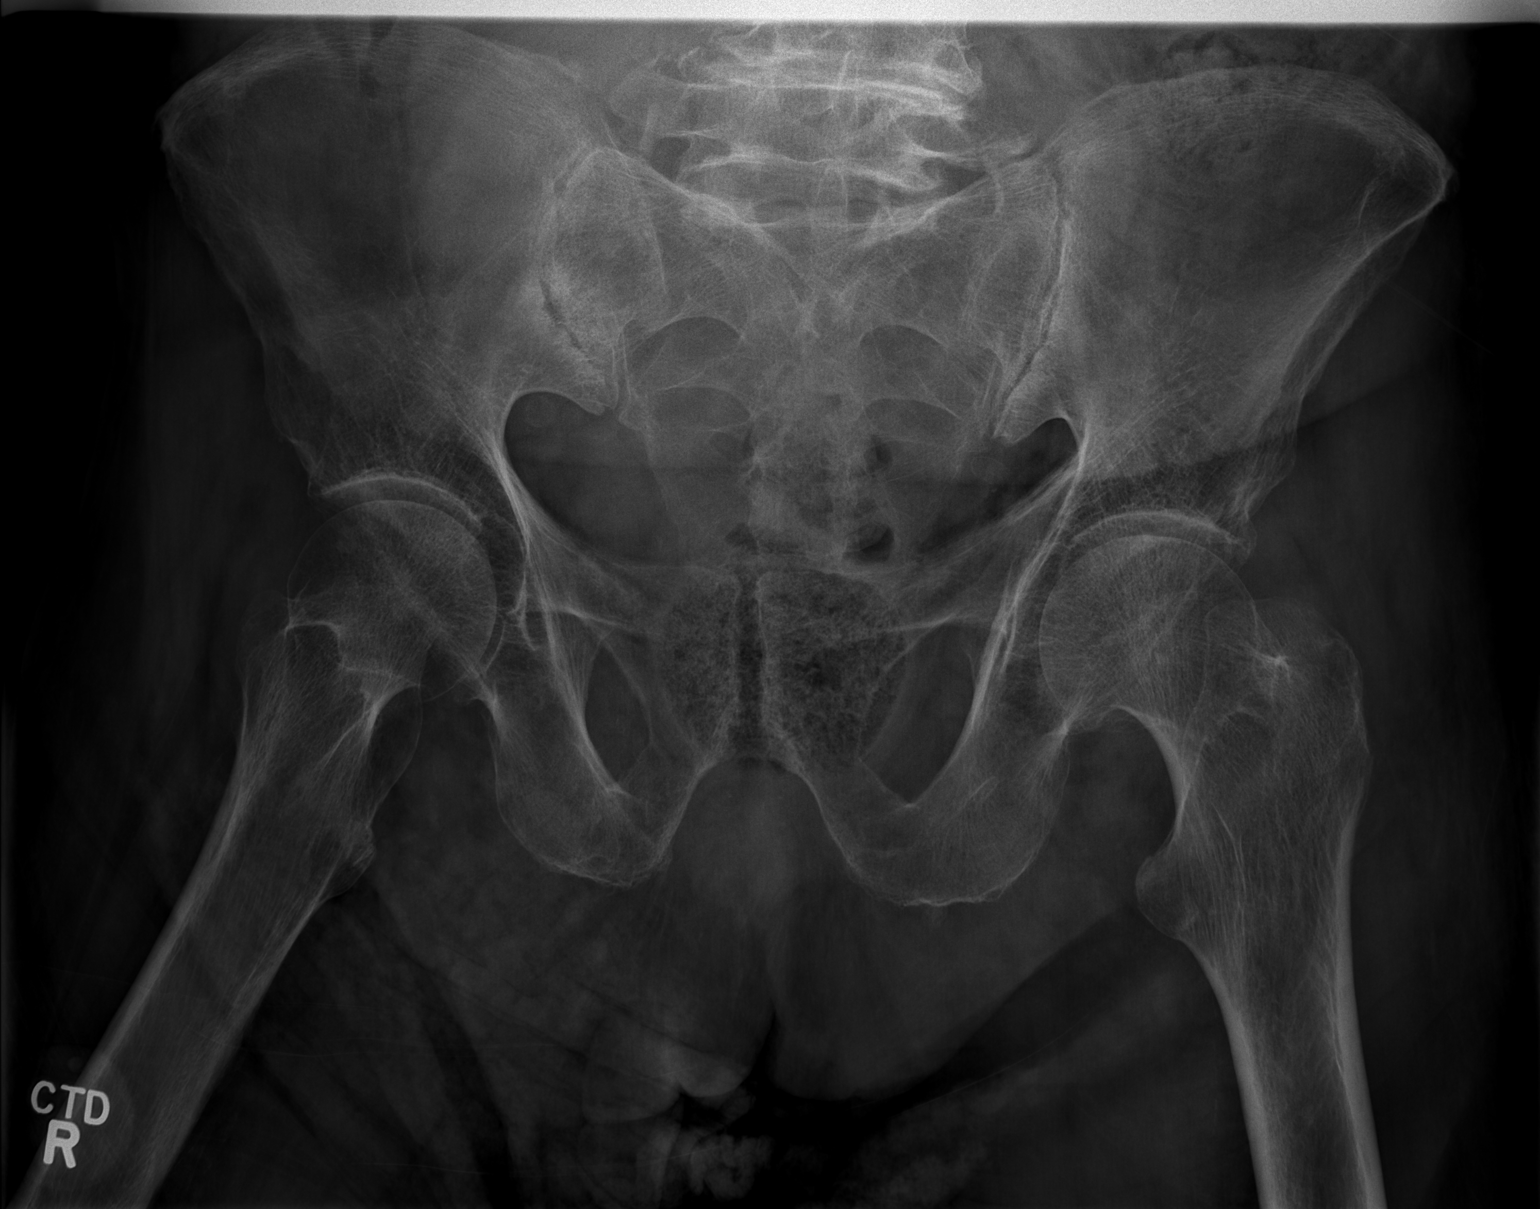
[im 2/3]
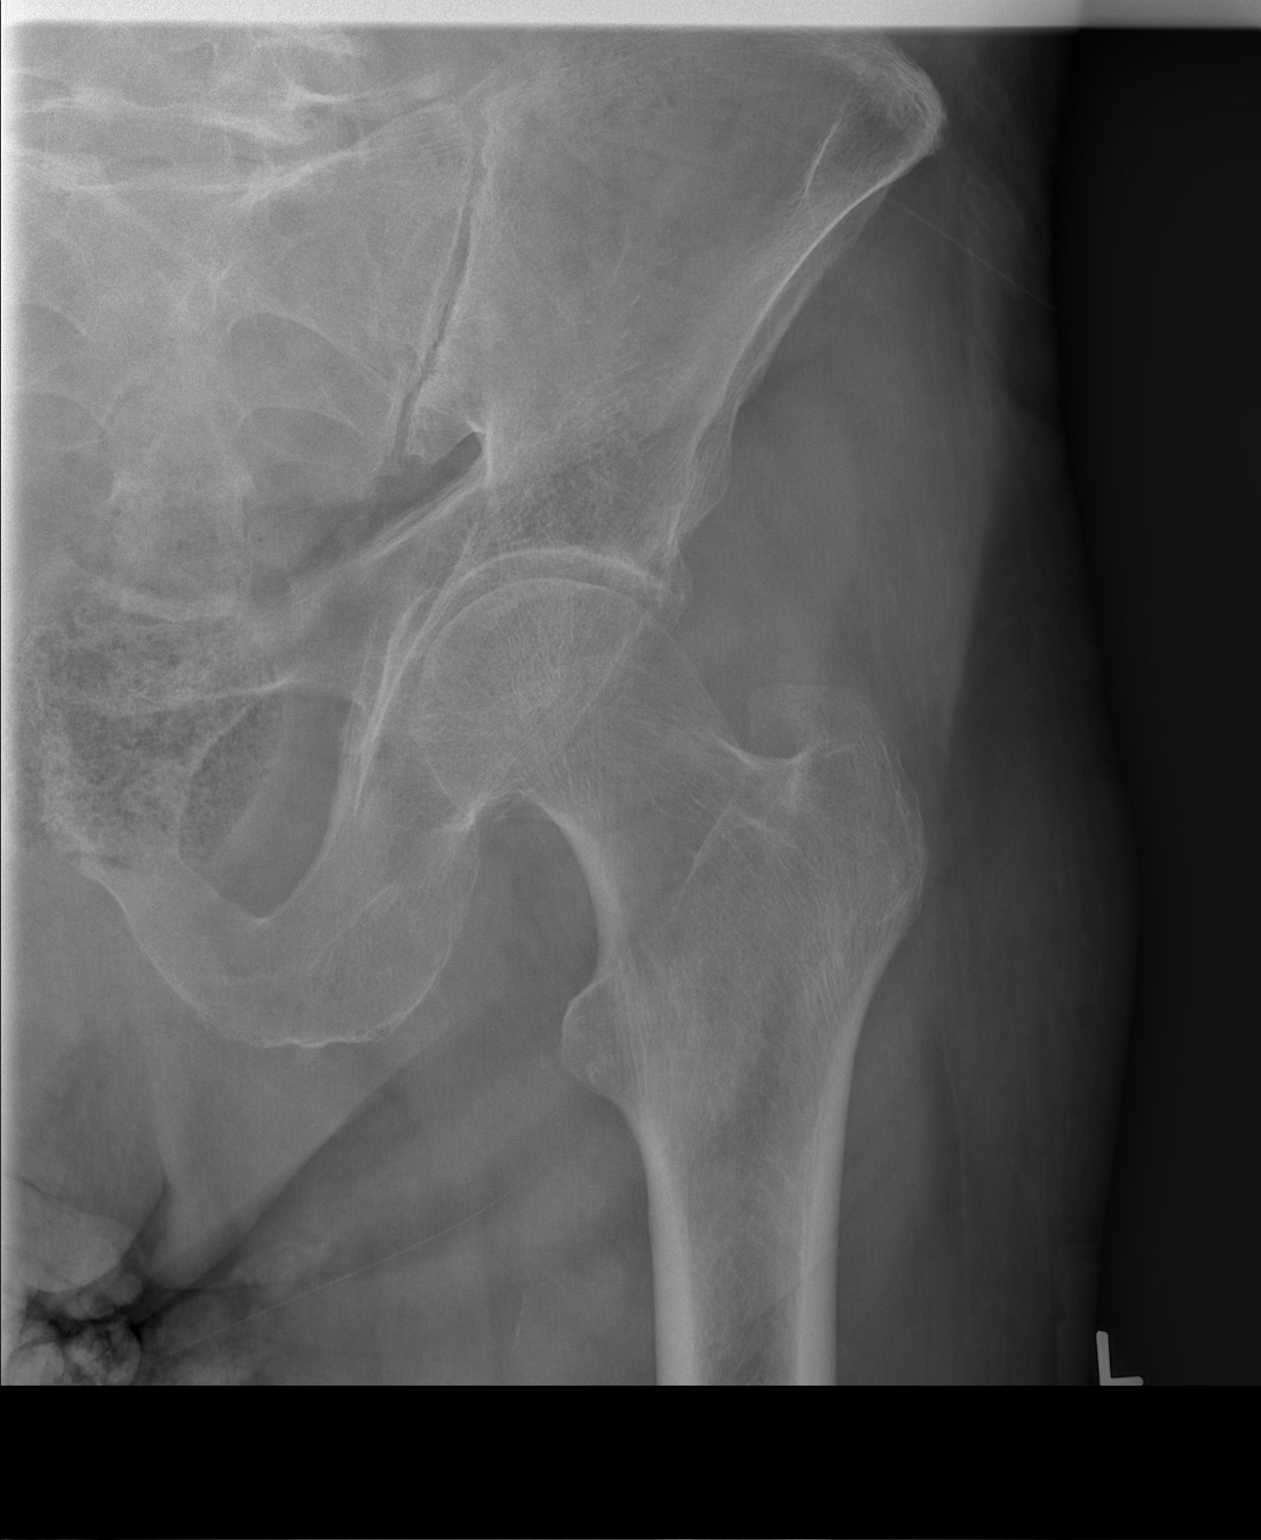
[im 3/3]
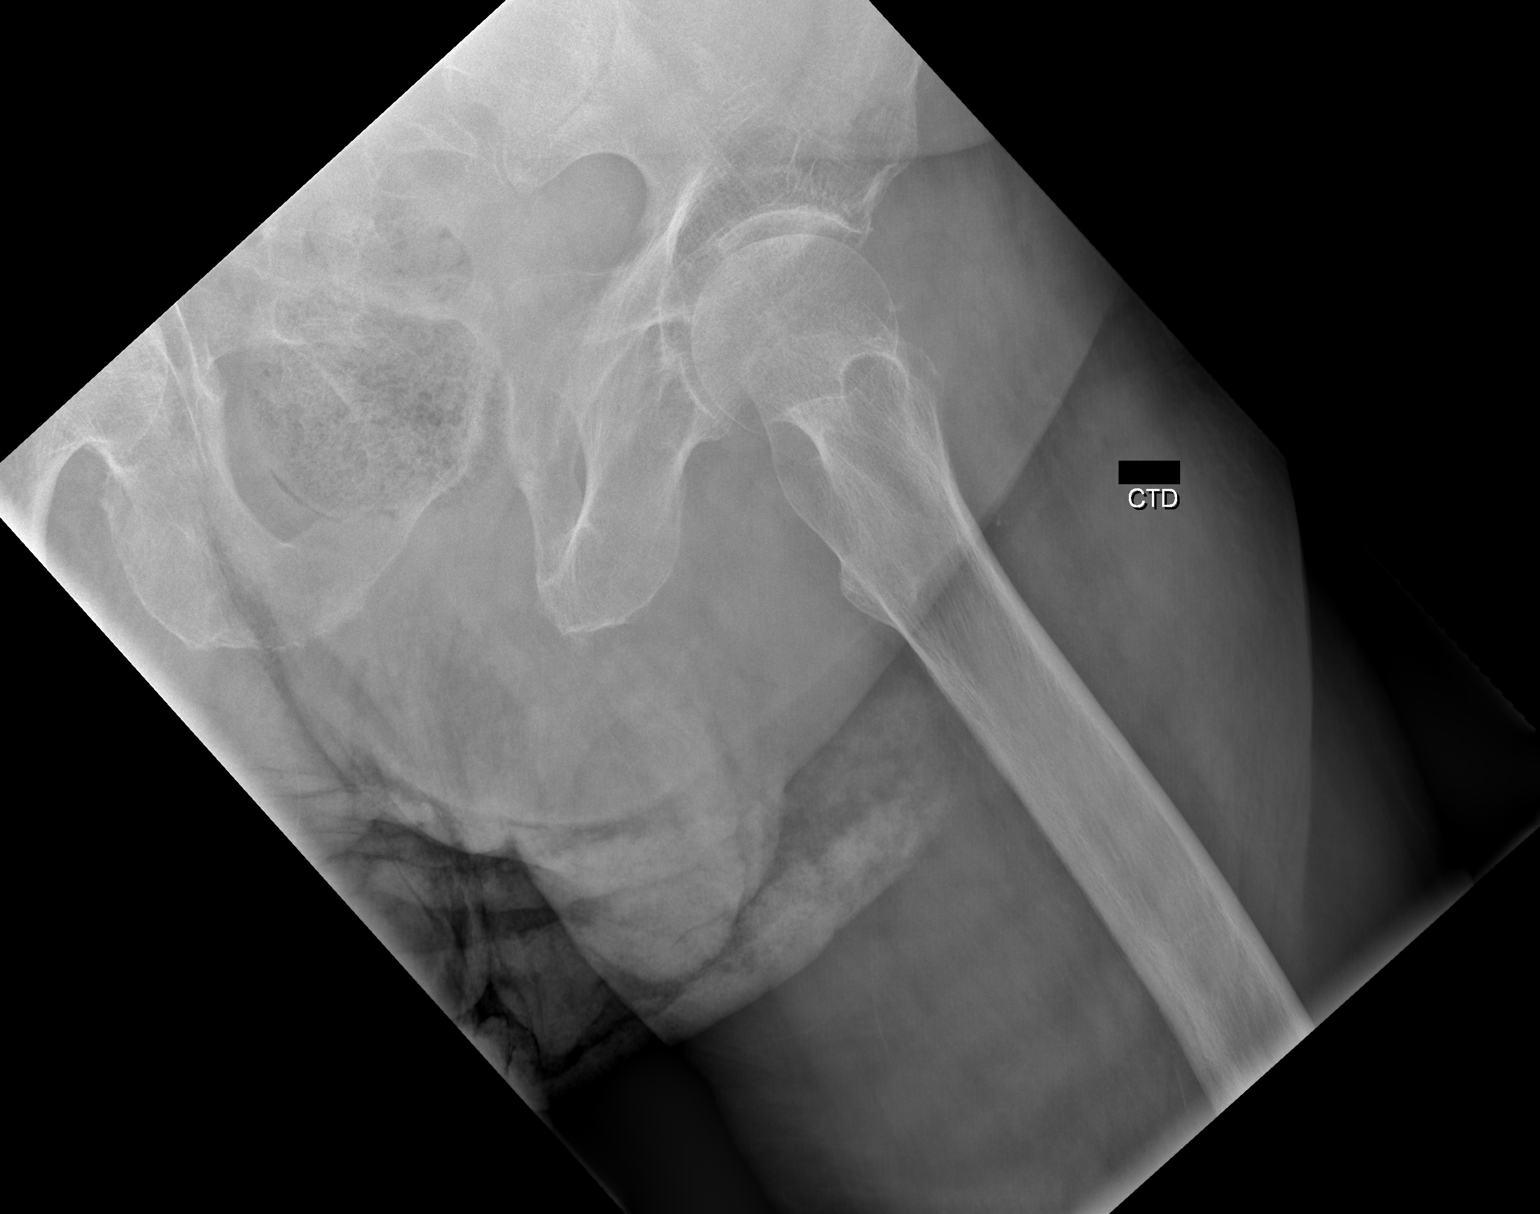

[3 of 3 positions shown; findings below may reference images not displayed]

FINDINGS: There is no evidence of hip fracture or dislocation. Mild osteopenia
without destructive bony lesions. There is no evidence of
arthropathy or other focal bone abnormality.
IMPRESSION: Negative.

  By: Moshee Yuan
